# Patient Record
Sex: Female | Born: 1980 | Hispanic: Yes | Smoking: Never smoker
Health system: Southern US, Community
[De-identification: ages and names within clinical notes are randomized; demographics above are authoritative.]

## PROBLEM LIST (undated history)

## (undated) DIAGNOSIS — Z5189 Encounter for other specified aftercare: Secondary | ICD-10-CM

## (undated) DIAGNOSIS — Z87442 Personal history of urinary calculi: Secondary | ICD-10-CM

## (undated) DIAGNOSIS — R112 Nausea with vomiting, unspecified: Secondary | ICD-10-CM

## (undated) DIAGNOSIS — N879 Dysplasia of cervix uteri, unspecified: Secondary | ICD-10-CM

## (undated) DIAGNOSIS — D509 Iron deficiency anemia, unspecified: Secondary | ICD-10-CM

## (undated) DIAGNOSIS — Z9889 Other specified postprocedural states: Secondary | ICD-10-CM

## (undated) HISTORY — DX: Dysplasia of cervix uteri, unspecified: N87.9

---

## 2009-10-17 HISTORY — PX: LEEP: SHX91

## 2011-05-24 ENCOUNTER — Other Ambulatory Visit: Payer: Self-pay | Admitting: Nurse Practitioner

## 2011-05-24 ENCOUNTER — Other Ambulatory Visit (HOSPITAL_COMMUNITY)
Admission: RE | Admit: 2011-05-24 | Discharge: 2011-05-24 | Disposition: A | Discharge status: Home / Self Care | Payer: Self-pay | Source: Ambulatory Visit | Attending: Family Medicine | Admitting: Family Medicine

## 2011-05-24 ENCOUNTER — Other Ambulatory Visit (HOSPITAL_COMMUNITY)
Admission: RE | Admit: 2011-05-24 | Discharge: 2011-05-24 | Disposition: A | Discharge status: Home / Self Care | Payer: Self-pay | Source: Ambulatory Visit | Attending: Unknown Physician Specialty | Admitting: Unknown Physician Specialty

## 2011-05-24 DIAGNOSIS — R87612 Low grade squamous intraepithelial lesion on cytologic smear of cervix (LGSIL): Secondary | ICD-10-CM | POA: Insufficient documentation

## 2011-05-24 DIAGNOSIS — N871 Moderate cervical dysplasia: Secondary | ICD-10-CM | POA: Insufficient documentation

## 2011-07-12 ENCOUNTER — Other Ambulatory Visit: Payer: Self-pay | Admitting: Obstetrics and Gynecology

## 2011-10-18 NOTE — L&D Delivery Note (Signed)
Delivery Note At 10:55 PM a viable female was delivered via Vaginal, Spontaneous Delivery (Presentation: Left Occiput Anterior).  APGAR: 9, 9; weight: pending.   Placenta status: Intact, Spontaneous, via Tomasa Blase.  Cord: 3 vessels with the following complications: None.    Anesthesia: None  Episiotomy: None Lacerations: None Suture Repair: none Est. Blood Loss (mL): 65  Mom to postpartum.  Baby skin-to-skin with mom.  [name], SNM 09/27/2012, 11:11 PM Supervised by: Jacklyn Shell, CNM

## 2011-10-18 NOTE — L&D Delivery Note (Signed)
I was present for delivery and agree with above

## 2012-05-20 LAB — CYSTIC FIBROSIS DIAGNOSTIC STUDY: INTERPRETATION-CFDNA: NEGATIVE

## 2012-05-30 ENCOUNTER — Other Ambulatory Visit (HOSPITAL_COMMUNITY): Payer: Self-pay | Admitting: Family

## 2012-05-30 DIAGNOSIS — Z3689 Encounter for other specified antenatal screening: Secondary | ICD-10-CM

## 2012-05-30 LAB — CYSTIC FIBROSIS DIAGNOSTIC STUDY: Interpretation-CFDNA:: NEGATIVE

## 2012-05-30 LAB — OB RESULTS CONSOLE ABO/RH

## 2012-05-30 LAB — OB RESULTS CONSOLE ANTIBODY SCREEN: Antibody Screen: POSITIVE

## 2012-05-30 LAB — OB RESULTS CONSOLE RUBELLA ANTIBODY, IGM: Rubella: IMMUNE

## 2012-05-30 LAB — SICKLE CELL SCREEN: SICKLE CELL SCREEN: NEGATIVE

## 2012-06-04 ENCOUNTER — Ambulatory Visit (HOSPITAL_COMMUNITY)
Admission: RE | Admit: 2012-06-04 | Discharge: 2012-06-04 | Disposition: A | Discharge status: Home / Self Care | Payer: Self-pay | Source: Ambulatory Visit | Attending: Family | Admitting: Family

## 2012-06-04 DIAGNOSIS — O358XX0 Maternal care for other (suspected) fetal abnormality and damage, not applicable or unspecified: Secondary | ICD-10-CM | POA: Insufficient documentation

## 2012-06-04 DIAGNOSIS — Z3689 Encounter for other specified antenatal screening: Secondary | ICD-10-CM

## 2012-06-04 DIAGNOSIS — Z1389 Encounter for screening for other disorder: Secondary | ICD-10-CM | POA: Insufficient documentation

## 2012-06-04 DIAGNOSIS — Z363 Encounter for antenatal screening for malformations: Secondary | ICD-10-CM | POA: Insufficient documentation

## 2012-08-21 ENCOUNTER — Other Ambulatory Visit (HOSPITAL_COMMUNITY): Payer: Self-pay | Admitting: Family

## 2012-08-21 DIAGNOSIS — R319 Hematuria, unspecified: Secondary | ICD-10-CM

## 2012-08-24 ENCOUNTER — Encounter (HOSPITAL_COMMUNITY): Payer: Self-pay

## 2012-08-24 ENCOUNTER — Ambulatory Visit (HOSPITAL_COMMUNITY)
Admission: RE | Admit: 2012-08-24 | Discharge: 2012-08-24 | Disposition: A | Discharge status: Home / Self Care | Payer: PRIVATE HEALTH INSURANCE | Source: Ambulatory Visit | Attending: Family | Admitting: Family

## 2012-08-24 DIAGNOSIS — N200 Calculus of kidney: Secondary | ICD-10-CM | POA: Insufficient documentation

## 2012-08-24 DIAGNOSIS — R109 Unspecified abdominal pain: Secondary | ICD-10-CM | POA: Insufficient documentation

## 2012-08-24 DIAGNOSIS — R319 Hematuria, unspecified: Secondary | ICD-10-CM | POA: Insufficient documentation

## 2012-08-24 DIAGNOSIS — O99891 Other specified diseases and conditions complicating pregnancy: Secondary | ICD-10-CM | POA: Insufficient documentation

## 2012-09-05 LAB — OB RESULTS CONSOLE GC/CHLAMYDIA
Chlamydia: NEGATIVE
Gonorrhea: NEGATIVE

## 2012-09-06 LAB — OB RESULTS CONSOLE GBS: GBS: NEGATIVE

## 2012-09-26 ENCOUNTER — Other Ambulatory Visit (HOSPITAL_COMMUNITY): Payer: Self-pay | Admitting: Physician Assistant

## 2012-09-26 DIAGNOSIS — O480 Post-term pregnancy: Secondary | ICD-10-CM

## 2012-09-27 ENCOUNTER — Encounter (HOSPITAL_COMMUNITY): Payer: Self-pay | Admitting: *Deleted

## 2012-09-27 ENCOUNTER — Inpatient Hospital Stay (HOSPITAL_COMMUNITY)
Admission: AD | Admit: 2012-09-27 | Discharge: 2012-09-29 | DRG: 775 | Disposition: A | Discharge status: Home / Self Care | Payer: Medicaid Other | Source: Ambulatory Visit | Attending: Obstetrics & Gynecology | Admitting: Obstetrics & Gynecology

## 2012-09-27 DIAGNOSIS — IMO0001 Reserved for inherently not codable concepts without codable children: Secondary | ICD-10-CM

## 2012-09-27 DIAGNOSIS — O2200 Varicose veins of lower extremity in pregnancy, unspecified trimester: Secondary | ICD-10-CM

## 2012-09-27 LAB — CBC
Hemoglobin: 10.3 g/dL — ABNORMAL LOW (ref 12.0–15.0)
MCHC: 32 g/dL (ref 30.0–36.0)
Platelets: 203 10*3/uL (ref 150–400)
RDW: 16.1 % — ABNORMAL HIGH (ref 11.5–15.5)

## 2012-09-27 MED ORDER — IBUPROFEN 600 MG PO TABS
600.0000 mg | ORAL_TABLET | Freq: Four times a day (QID) | ORAL | Status: DC | PRN
  Administered 2012-09-27: 600 mg via ORAL
  Filled 2012-09-27: qty 1

## 2012-09-27 MED ORDER — LACTATED RINGERS IV SOLN: 500.0000 mL | INTRAVENOUS | Status: DC | PRN

## 2012-09-27 MED ORDER — ACETAMINOPHEN 325 MG PO TABS: 650.0000 mg | ORAL_TABLET | ORAL | Status: DC | PRN

## 2012-09-27 MED ORDER — ONDANSETRON HCL 4 MG/2ML IJ SOLN: 4.0000 mg | Freq: Four times a day (QID) | INTRAMUSCULAR | Status: DC | PRN

## 2012-09-27 MED ORDER — OXYTOCIN BOLUS FROM INFUSION: 500.0000 mL | INTRAVENOUS | Status: DC

## 2012-09-27 MED ORDER — LIDOCAINE HCL (PF) 1 % IJ SOLN: 30.0000 mL | INTRAMUSCULAR | Status: DC | PRN

## 2012-09-27 MED ORDER — FLEET ENEMA 7-19 GM/118ML RE ENEM: 1.0000 | ENEMA | RECTAL | Status: DC | PRN

## 2012-09-27 MED ORDER — OXYCODONE-ACETAMINOPHEN 5-325 MG PO TABS: 1.0000 | ORAL_TABLET | ORAL | Status: DC | PRN

## 2012-09-27 MED ORDER — CITRIC ACID-SODIUM CITRATE 334-500 MG/5ML PO SOLN: 30.0000 mL | ORAL | Status: DC | PRN

## 2012-09-27 MED ORDER — LACTATED RINGERS IV SOLN
INTRAVENOUS | Status: DC
  Administered 2012-09-27: 22:00:00 via INTRAVENOUS

## 2012-09-27 MED ORDER — OXYTOCIN 40 UNITS IN LACTATED RINGERS INFUSION - SIMPLE MED
62.5000 mL/h | INTRAVENOUS | Status: DC
  Administered 2012-09-27: 62.5 mL/h via INTRAVENOUS
  Filled 2012-09-27: qty 1000

## 2012-09-27 NOTE — MAU Note (Signed)
Contractions every 3-5 minutes for since yesterday. No vaginal bleeding or leaking of fluid

## 2012-09-27 NOTE — H&P (Signed)
Sharonlee Nine is a 31 y.o. female [id number] at [redacted] weeks gestation by second trimester ultrasound presenting for frequent contractions since yesterday at 8:00pm.   No vaginal bleeding or leaking of fluid.  Reports good fetal movement. Maternal Medical History:  Reason for admission: Reason for admission: contractions.  Contractions: Onset was yesterday.   Frequency: regular.   Duration is approximately 3 minutes.   Perceived severity is moderate.    Fetal activity: Perceived fetal activity is normal.   Last perceived fetal movement was within the past hour.      OB History    Grav Para Term Preterm Abortions TAB SAB Ect Mult Living   6 5 5       5      No past medical history on file. No past surgical history on file. Family History: family history is not on file. Social History:  does not have a smoking history on file. She does not have any smokeless tobacco history on file. Her alcohol and drug histories not on file.   Prenatal Transfer Tool  Maternal Diabetes: No Genetic Screening: Negative cystic fibrosis carrier screening, too late for other genetic screening Maternal Ultrasounds/Referrals: Normal Fetal Ultrasounds or other Referrals:  None Maternal Substance Abuse:  No Significant Maternal Medications:  None Significant Maternal Lab Results:  Lab values include: Other: Positive Anitbody Screen: Anti-E Other Comments:  None  Review of Systems  Constitutional: Negative.   HENT: Negative.   Eyes: Negative.   Respiratory: Negative.   Cardiovascular: Negative.   Gastrointestinal: Negative.   Genitourinary: Negative.   Musculoskeletal: Negative.   Skin: Negative.   Neurological: Negative.   Endo/Heme/Allergies: Negative.   Psychiatric/Behavioral: Negative.     Dilation: 6.5 Effacement (%): 90 Station: -2 Exam by:: L. Munford RN & [name], SNM Blood pressure 132/91, pulse 90, temperature 97.8 F (36.6 C), temperature source Oral, resp. rate 20. Maternal  Exam:  Uterine Assessment: Contraction strength is moderate.  Contraction duration is 3 minutes. Contraction frequency is regular.   Abdomen: Patient reports no abdominal tenderness. Fetal presentation: vertex  Introitus: Normal vulva. Pelvis: adequate for delivery.   Cervix: Cervix evaluated by digital exam.     Fetal Exam Fetal Monitor Review: Mode: ultrasound.   Baseline rate: 140.  Variability: moderate (6-25 bpm).   Pattern: accelerations present.    Fetal State Assessment: Category I - tracings are normal.     Physical Exam  Constitutional: She is oriented to person, place, and time. She appears well-developed and well-nourished.  HENT:  Head: Normocephalic and atraumatic.  Eyes: Conjunctivae normal are normal.  Neck: Normal range of motion. Neck supple.  Cardiovascular: Normal rate, regular rhythm, normal heart sounds and intact distal pulses.   Respiratory: Effort normal and breath sounds normal.  GI: Soft. Bowel sounds are normal.       Gravid  Genitourinary: Uterus normal.  Musculoskeletal: Normal range of motion.  Neurological: She is alert and oriented to person, place, and time. She has normal reflexes.  Skin: Skin is warm and dry.  Psychiatric: She has a normal mood and affect. Her behavior is normal. Judgment and thought content normal.    Prenatal labs: ABO, Rh:  A Positive Antibody:  Positive, Anti-E Rubella:  Immune  RPR:  Non-reactive  HBsAg:  Negative HIV:  Non-Reactive GBS:  Negative   Assessment/Plan: 31 year old [id number], IUP @ [redacted] wks gestation  Active Labor Advanced Dilation  Admit to L&D Routine L&D orders Patient may have epidural upon request  [name], SNM 09/27/2012, 10:19 PM Supervised by: Jacklyn Shell, CNM

## 2012-09-28 ENCOUNTER — Encounter (HOSPITAL_COMMUNITY): Payer: Self-pay | Admitting: *Deleted

## 2012-09-28 ENCOUNTER — Ambulatory Visit (HOSPITAL_COMMUNITY): Admission: RE | Admit: 2012-09-28 | Payer: Self-pay | Source: Ambulatory Visit

## 2012-09-28 LAB — RPR: RPR Ser Ql: NONREACTIVE

## 2012-09-28 MED ORDER — ZOLPIDEM TARTRATE 5 MG PO TABS: 5.0000 mg | ORAL_TABLET | Freq: Every evening | ORAL | Status: DC | PRN

## 2012-09-28 MED ORDER — DIPHENHYDRAMINE HCL 25 MG PO CAPS: 25.0000 mg | ORAL_CAPSULE | Freq: Four times a day (QID) | ORAL | Status: DC | PRN

## 2012-09-28 MED ORDER — PRENATAL MULTIVITAMIN CH
1.0000 | ORAL_TABLET | Freq: Every day | ORAL | Status: DC
  Administered 2012-09-28 – 2012-09-29 (×2): 1 via ORAL
  Filled 2012-09-28 (×2): qty 1

## 2012-09-28 MED ORDER — ONDANSETRON HCL 4 MG PO TABS: 4.0000 mg | ORAL_TABLET | ORAL | Status: DC | PRN

## 2012-09-28 MED ORDER — BENZOCAINE-MENTHOL 20-0.5 % EX AERO: 1.0000 "application " | INHALATION_SPRAY | CUTANEOUS | Status: DC | PRN

## 2012-09-28 MED ORDER — LANOLIN HYDROUS EX OINT: TOPICAL_OINTMENT | CUTANEOUS | Status: DC | PRN

## 2012-09-28 MED ORDER — ONDANSETRON HCL 4 MG/2ML IJ SOLN: 4.0000 mg | INTRAMUSCULAR | Status: DC | PRN

## 2012-09-28 MED ORDER — MEASLES, MUMPS & RUBELLA VAC ~~LOC~~ INJ
0.5000 mL | INJECTION | Freq: Once | SUBCUTANEOUS | Status: DC
  Filled 2012-09-28: qty 0.5

## 2012-09-28 MED ORDER — TETANUS-DIPHTH-ACELL PERTUSSIS 5-2.5-18.5 LF-MCG/0.5 IM SUSP: 0.5000 mL | Freq: Once | INTRAMUSCULAR | Status: DC

## 2012-09-28 MED ORDER — DIBUCAINE 1 % RE OINT: 1.0000 "application " | TOPICAL_OINTMENT | RECTAL | Status: DC | PRN

## 2012-09-28 MED ORDER — METHYLERGONOVINE MALEATE 0.2 MG PO TABS: 0.2000 mg | ORAL_TABLET | ORAL | Status: DC | PRN

## 2012-09-28 MED ORDER — CEPHALEXIN 500 MG PO CAPS
500.0000 mg | ORAL_CAPSULE | Freq: Two times a day (BID) | ORAL | Status: DC
  Administered 2012-09-28 – 2012-09-29 (×2): 500 mg via ORAL
  Filled 2012-09-28 (×4): qty 1

## 2012-09-28 MED ORDER — WITCH HAZEL-GLYCERIN EX PADS: 1.0000 "application " | MEDICATED_PAD | CUTANEOUS | Status: DC | PRN

## 2012-09-28 MED ORDER — METHYLERGONOVINE MALEATE 0.2 MG/ML IJ SOLN: 0.2000 mg | INTRAMUSCULAR | Status: DC | PRN

## 2012-09-28 MED ORDER — SENNOSIDES-DOCUSATE SODIUM 8.6-50 MG PO TABS
2.0000 | ORAL_TABLET | Freq: Every day | ORAL | Status: DC
  Administered 2012-09-28: 2 via ORAL

## 2012-09-28 MED ORDER — SIMETHICONE 80 MG PO CHEW: 80.0000 mg | CHEWABLE_TABLET | ORAL | Status: DC | PRN

## 2012-09-28 MED ORDER — IBUPROFEN 600 MG PO TABS
600.0000 mg | ORAL_TABLET | Freq: Four times a day (QID) | ORAL | Status: DC
  Administered 2012-09-28 – 2012-09-29 (×5): 600 mg via ORAL
  Filled 2012-09-28 (×5): qty 1

## 2012-09-28 MED ORDER — OXYCODONE-ACETAMINOPHEN 5-325 MG PO TABS: 1.0000 | ORAL_TABLET | ORAL | Status: DC | PRN

## 2012-09-28 NOTE — Progress Notes (Signed)
Post Partum Day 1  Subjective: no complaints, up ad lib, voiding, tolerating PO and + flatus Patient reports her pain level is well controlled.  Objective: Blood pressure 103/61, pulse 69, temperature 98.4 F (36.9 C), temperature source Oral, resp. rate 18, height 5\' 2"  (1.575 m), weight 85.73 kg (189 lb), unknown if currently breastfeeding.  Physical Exam:  General: alert, cooperative and no distress Lochia: appropriate Uterine Fundus: firm DVT Evaluation: No evidence of DVT seen on physical exam. No cords or calf tenderness.   Basename 09/27/12 2225  HGB 10.3*  HCT 32.2*    Assessment/Plan: Plan for discharge tomorrow, Breastfeeding and Social Work consult Patient is trying to breastfeed, wants OCP for birth control, no circumcision. 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   LOS: 1 day   Ruth Stewart 09/28/2012, 4:09 PM

## 2012-09-28 NOTE — Progress Notes (Signed)
UR chart review completed.  

## 2012-09-29 MED ORDER — IBUPROFEN 600 MG PO TABS: 600.0000 mg | ORAL_TABLET | Freq: Four times a day (QID) | ORAL | Status: DC | PRN
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
## 2012-09-29 NOTE — Clinical Social Work Note (Signed)
CSW spoke with MOB and interpreter in room.  No concerns for discharge at this time.  CSW informed RN of no concerns for discharge.  CSW will complete full consult report for Epic later today.  [phone] 

## 2012-09-30 NOTE — Progress Notes (Signed)
I saw and examined patient and agree with above. Napoleon Form, MD

## 2012-10-01 NOTE — Discharge Summary (Signed)
Attestation of Attending Supervision of Advanced Practitioner (CNM/NP): Evaluation and management procedures were performed by the Advanced Practitioner under my supervision and collaboration.  I have reviewed the Advanced Practitioner's note and chart, and I agree with the management and plan.  Samaira Holzworth 10/01/2012 3:42 PM

## 2012-10-05 ENCOUNTER — Other Ambulatory Visit: Payer: Self-pay

## 2012-10-06 ENCOUNTER — Inpatient Hospital Stay (HOSPITAL_COMMUNITY): Admission: RE | Admit: 2012-10-06 | Payer: Self-pay | Source: Ambulatory Visit

## 2014-08-18 ENCOUNTER — Encounter (HOSPITAL_COMMUNITY): Payer: Self-pay | Admitting: *Deleted

## 2017-10-17 NOTE — L&D Delivery Note (Addendum)
Delivery Note At 5:31 PM a viable female was delivered via Vaginal, Spontaneous (Presentation: ;  ).  APGAR: 9, 10; weight Pending .   Placenta status:Complete , .  Cord:3 vessel  with the following complications: none .  Cord pH: NA  Anesthesia: None Episiotomy: None Lacerations:None   Suture Repair: none Est. Blood Loss (mL): 400  Mom to postpartum.  Baby to Couplet care / Skin to Skin.  Sandi Ravelingnson B Alvis 05/10/2018, 5:56 PM  Please schedule this patient for Postpartum visit in: 4 weeks with the following provider: Any provider For C/S patients schedule nurse incision check in weeks 2 weeks: NA High risk pregnancy complicated by: Multiple antibodies, elevated AFP Delivery mode:  SVD Anticipated Birth Control:  other/unsure PP Procedures needed: None  Schedule Integrated BH visit: no  I was present for the entire delivery of baby and placenta and inspection of the perineum and agree with above.  Katrinka BlazingSmith, [place], [place] 05/10/2018 8:32 PM

## 2017-11-23 LAB — CYTOLOGY - PAP: Pap: NEGATIVE

## 2017-11-23 LAB — OB RESULTS CONSOLE GC/CHLAMYDIA
CHLAMYDIA, DNA PROBE: NEGATIVE
GC PROBE AMP, GENITAL: NEGATIVE

## 2017-11-23 LAB — OB RESULTS CONSOLE VARICELLA ZOSTER ANTIBODY, IGG: Varicella: NON-IMMUNE/NOT IMMUNE

## 2017-11-23 LAB — OB RESULTS CONSOLE HEPATITIS B SURFACE ANTIGEN: Hepatitis B Surface Ag: NEGATIVE

## 2017-11-23 LAB — OB RESULTS CONSOLE HIV ANTIBODY (ROUTINE TESTING): HIV: NONREACTIVE

## 2017-11-23 LAB — OB RESULTS CONSOLE PLATELET COUNT: Platelets: 271

## 2017-11-23 LAB — GLUCOSE, 1 HOUR

## 2017-11-23 LAB — OB RESULTS CONSOLE ABO/RH: RH Type: POSITIVE

## 2017-11-23 LAB — OB RESULTS CONSOLE HGB/HCT, BLOOD
HEMATOCRIT: 31
HEMOGLOBIN: 10.2

## 2017-11-23 LAB — OB RESULTS CONSOLE ANTIBODY SCREEN: ANTIBODY SCREEN: POSITIVE

## 2017-11-23 LAB — OB RESULTS CONSOLE RPR: RPR: NONREACTIVE

## 2017-11-23 LAB — OB RESULTS CONSOLE RUBELLA ANTIBODY, IGM: Rubella: IMMUNE

## 2017-11-23 LAB — CULTURE, OB URINE: Urine Culture, OB: POSITIVE

## 2017-11-27 ENCOUNTER — Encounter (HOSPITAL_COMMUNITY): Payer: Self-pay | Admitting: *Deleted

## 2017-11-28 ENCOUNTER — Other Ambulatory Visit: Payer: Self-pay

## 2017-11-28 ENCOUNTER — Ambulatory Visit (HOSPITAL_COMMUNITY)
Admission: RE | Admit: 2017-11-28 | Discharge: 2017-11-28 | Disposition: A | Discharge status: Home / Self Care | Payer: PRIVATE HEALTH INSURANCE | Source: Ambulatory Visit | Attending: Nurse Practitioner | Admitting: Nurse Practitioner

## 2017-11-28 DIAGNOSIS — O09529 Supervision of elderly multigravida, unspecified trimester: Secondary | ICD-10-CM

## 2017-11-28 DIAGNOSIS — Z3A16 16 weeks gestation of pregnancy: Secondary | ICD-10-CM | POA: Insufficient documentation
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
## 2018-01-08 ENCOUNTER — Encounter (HOSPITAL_COMMUNITY): Payer: Self-pay

## 2018-01-09 ENCOUNTER — Encounter (HOSPITAL_COMMUNITY): Payer: Self-pay

## 2018-01-09 ENCOUNTER — Ambulatory Visit (HOSPITAL_COMMUNITY)
Admission: RE | Admit: 2018-01-09 | Discharge: 2018-01-09 | Disposition: A | Discharge status: Home / Self Care | Payer: PRIVATE HEALTH INSURANCE | Source: Ambulatory Visit | Attending: Nurse Practitioner | Admitting: Nurse Practitioner

## 2018-01-09 DIAGNOSIS — Z3A22 22 weeks gestation of pregnancy: Secondary | ICD-10-CM | POA: Insufficient documentation

## 2018-01-09 DIAGNOSIS — O360920 Maternal care for other rhesus isoimmunization, second trimester, not applicable or unspecified: Secondary | ICD-10-CM | POA: Insufficient documentation

## 2018-01-09 HISTORY — DX: Encounter for other specified aftercare: Z51.89

## 2018-01-09 NOTE — Consult Note (Signed)
MFM Staff Note, Consultation ? Impressions: 1. 37yo [id number] with SIUP at 6070w1d 2. 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 AMA, declined genetic counseling/NIPS/amniocentesis ? Discussion:  By way of consultation with on site Spanish translator, I spoke to Ruth Stewart  about her red blood cell sensitization. In simple terms, I explained the concepts of red cell antigens, antibody response, and alloimmunization. 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 Although she herself will have no health consequences from the sensitization, each time she carries an incompatible fetus, it will be at risk.   I explained the inheritance pattern of red cell antigens.  The father of the baby is not here today for testing.  I told her that my assumption is that her partner/husband is antigen-positive, but it is unknown whether he is a homozygote or heterozygote. As you know, the risk of the fetus being incompatible and at risk for hemolysis and anemia is 50% if he is heterozygous, and 100% if he is homozygous for the antigen. I told her that if RBC genotyping is desired, she can have him present to your office.    I went over the mechanism of fetal anemia due to transplacental antibody passage. I detailed the usual management of women with alloimmunization and an incompatible fetus, including the use of serial antibody titers, the concept of the "critical titer", and the need for middle cerebral artery blood flow measurement to estimate the degree of anemia.  Ruth Stewart  does not have a previous affected pregnancy, and her anti-E titer 1:1 is exceedingly low. Based solely on this, I told her that it is very unlikely that her fetus will be severely anemic, either before or after birth. I recommended that she have antibody titers drawn monthly. So long as her titer  is 1:8 or less (<1:16), no MCA studies will be recommended, and she can be allowed to carry to term. On the other hand, if the titers rise to 1:16 or greater, re-evaluation will be indicated, with MCA Dopplers weekly.    RBC genotyping is important so if desired this may be done in your office/lab provided the FOB will present to be testing. RBC genotyping of dad is important for prognosis of future pregnancies (zygosity will dictate recurrence risk: 100% if he is homozygous for E-antigen, 50% if he is heterozygous). ? After birth, the pediatricians should be notified of her antibody status, so that the newborn can be followed closely for signs of hyperbilirubinemia.  Recommendations:  1. monthly antibody titers in your office 2. Referral to MFM for MCA Dopplers if a critical titer is seen.  MCA Dopplers and interval growth will be needed only if titer is 1:16 or greater (note: recommend co-management or transfer of care to MFM in setting of critical titer) 3. paternal RBC genotyping for Rh zygosity is important and I recommend it be [facility] (future pregnancy counseling) 4. Given maternal age of 37, I recommend interval growth evaluation every 6 weeks. 5. While at this point, she can deliver at 39-41 weeks with no critical titer.  Critical titer would lead to recommendation for delivery at 38 weeks provided MCA Dopplers and antenatal testing are reassuring (only needed in setting of at-risk/affected fetus and again should be under MFM surveillance as further recommendations may apply in such context).  I spent in excess  of 60 minutes in evaluation, review, and counseling of your patient with >50% of this time being spent on direct face-to-face counseling.  Ruth Nora, MD, MS, FACOG Assistant Professor Section of Maternal-Fetal Medicine [facility]

## 2018-01-09 NOTE — Progress Notes (Signed)
MFM Staff Note, Consultation ? Impressions: 1. 37yo [id number] with SIUP at 6070w1d 2. 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 AMA, declined genetic counseling/NIPS/amniocentesis ? Discussion:  By way of consultation with on site Spanish translator, I spoke to Ruth Stewart  about her red blood cell sensitization. In simple terms, I explained the concepts of red cell antigens, antibody response, and alloimmunization. 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 Although she herself will have no health consequences from the sensitization, each time she carries an incompatible fetus, it will be at risk.   I explained the inheritance pattern of red cell antigens.  The father of the baby is not here today for testing.  I told her that my assumption is that her partner/husband is antigen-positive, but it is unknown whether he is a homozygote or heterozygote. As you know, the risk of the fetus being incompatible and at risk for hemolysis and anemia is 50% if he is heterozygous, and 100% if he is homozygous for the antigen. I told her that if RBC genotyping is desired, she can have him present to your office.    I went over the mechanism of fetal anemia due to transplacental antibody passage. I detailed the usual management of women with alloimmunization and an incompatible fetus, including the use of serial antibody titers, the concept of the "critical titer", and the need for middle cerebral artery blood flow measurement to estimate the degree of anemia.  Ruth Stewart  does not have a previous affected pregnancy, and her anti-E titer 1:1 is exceedingly low. Based solely on this, I told her that it is very unlikely that her fetus will be severely anemic, either before or after birth. I recommended that she have antibody titers drawn monthly. So long as her titer  is 1:8 or less (<1:16), no MCA studies will be recommended, and she can be allowed to carry to term. On the other hand, if the titers rise to 1:16 or greater, re-evaluation will be indicated, with MCA Dopplers weekly.    RBC genotyping is important so if desired this may be done in your office/lab provided the FOB will present to be testing. RBC genotyping of dad is important for prognosis of future pregnancies (zygosity will dictate recurrence risk: 100% if he is homozygous for E-antigen, 50% if he is heterozygous). ? After birth, the pediatricians should be notified of her antibody status, so that the newborn can be followed closely for signs of hyperbilirubinemia.  Recommendations:  1. monthly antibody titers in your office 2. Referral to MFM for MCA Dopplers if a critical titer is seen.  MCA Dopplers and interval growth will be needed only if titer is 1:16 or greater (note: recommend co-management or transfer of care to MFM in setting of critical titer) 3. paternal RBC genotyping for Rh zygosity is important and I recommend it be [facility] (future pregnancy counseling) 4. Given maternal age of 37, I recommend interval growth evaluation every 6 weeks. 5. While at this point, she can deliver at 39-41 weeks with no critical titer.  Critical titer would lead to recommendation for delivery at 38 weeks provided MCA Dopplers and antenatal testing are reassuring (only needed in setting of at-risk/affected fetus and again should be under MFM surveillance as further recommendations may apply in such context).  I spent in excess  of 60 minutes in evaluation, review, and counseling of your patient with >50% of this time being spent on direct face-to-face counseling.  Durwin Nora, MD, MS, FACOG Assistant Professor Section of Maternal-Fetal Medicine [facility]

## 2018-01-11 ENCOUNTER — Other Ambulatory Visit: Payer: Self-pay

## 2018-02-05 ENCOUNTER — Encounter: Payer: Self-pay | Admitting: *Deleted

## 2018-02-07 ENCOUNTER — Encounter: Payer: Self-pay | Admitting: Obstetrics and Gynecology

## 2018-02-07 ENCOUNTER — Ambulatory Visit (INDEPENDENT_AMBULATORY_CARE_PROVIDER_SITE_OTHER): Payer: Self-pay | Admitting: Obstetrics and Gynecology

## 2018-02-07 VITALS — BP 91/62 | HR 106 | Wt 202.0 lb

## 2018-02-07 DIAGNOSIS — O09219 Supervision of pregnancy with history of pre-term labor, unspecified trimester: Secondary | ICD-10-CM

## 2018-02-07 DIAGNOSIS — Z789 Other specified health status: Secondary | ICD-10-CM

## 2018-02-07 DIAGNOSIS — R772 Abnormality of alphafetoprotein: Secondary | ICD-10-CM | POA: Insufficient documentation

## 2018-02-07 DIAGNOSIS — O0990 Supervision of high risk pregnancy, unspecified, unspecified trimester: Secondary | ICD-10-CM

## 2018-02-07 DIAGNOSIS — O360930 Maternal care for other rhesus isoimmunization, third trimester, not applicable or unspecified: Secondary | ICD-10-CM | POA: Insufficient documentation

## 2018-02-07 DIAGNOSIS — O2612 Low weight gain in pregnancy, second trimester: Secondary | ICD-10-CM

## 2018-02-07 DIAGNOSIS — Z758 Other problems related to medical facilities and other health care: Secondary | ICD-10-CM | POA: Insufficient documentation

## 2018-02-07 DIAGNOSIS — O2610 Low weight gain in pregnancy, unspecified trimester: Secondary | ICD-10-CM | POA: Insufficient documentation

## 2018-02-07 DIAGNOSIS — O09899 Supervision of other high risk pregnancies, unspecified trimester: Secondary | ICD-10-CM | POA: Insufficient documentation

## 2018-02-07 DIAGNOSIS — O09212 Supervision of pregnancy with history of pre-term labor, second trimester: Secondary | ICD-10-CM

## 2018-02-07 DIAGNOSIS — O360920 Maternal care for other rhesus isoimmunization, second trimester, not applicable or unspecified: Secondary | ICD-10-CM

## 2018-02-07 DIAGNOSIS — Z87442 Personal history of urinary calculi: Secondary | ICD-10-CM

## 2018-02-07 DIAGNOSIS — O0992 Supervision of high risk pregnancy, unspecified, second trimester: Secondary | ICD-10-CM

## 2018-02-07 LAB — POCT URINALYSIS DIP (DEVICE)
Glucose, UA: NEGATIVE mg/dL
Nitrite: POSITIVE — AB
Protein, ur: 100 mg/dL — AB
UROBILINOGEN UA: 0.2 mg/dL (ref 0.0–1.0)
pH: 6.5 (ref 5.0–8.0)

## 2018-02-07 MED ORDER — PRENATAL VITAMINS 0.8 MG PO TABS: 1.0000 | ORAL_TABLET | Freq: Every day | ORAL | 12 refills | Status: DC

## 2018-02-07 MED ORDER — CEPHALEXIN 500 MG PO CAPS: 500.0000 mg | ORAL_CAPSULE | Freq: Three times a day (TID) | ORAL | 0 refills | Status: DC

## 2018-02-07 NOTE — Progress Notes (Signed)
Here for new ob appt. Transferred from health department. C/o pain on 02/03/18 around her umbilicus and all the down her abdomen. Given new patient booklets. Sent MyChart text to sign up. Signed up for [facility] app only.

## 2018-02-07 NOTE — Progress Notes (Signed)
   PRENATAL VISIT NOTE - transfer from [facility]  Subjective:  Ruth Stewart is a 37 y.o. [id number] at 4023w2d being seen today for ongoing prenatal care.  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  Patient reports no complaints.  Contractions: Not present. Vag. Bleeding: None.  Movement: Present. Denies leaking of fluid.   Patient has h/o 5 x SVD. One pre-term delivery at 36 weeks, patient reports she was induced but not sure why. Had declined 17P in prior pregnancy and this one.  The following portions of the patient's history were reviewed and updated as appropriate: allergies, current medications, past family history, past medical history, past social history, past surgical history and problem list. Problem list updated.  Objective:   Vitals:   02/07/18 1525  BP: 91/62  Pulse: (!) 106  Weight: 202 lb (91.6 kg)    Fetal Status: Fetal Heart Rate (bpm): 149   Movement: Present     General:  Alert, oriented and cooperative. Patient is in no acute distress.  Skin: Skin is warm and dry. No rash noted.   Cardiovascular: Normal heart rate noted  Respiratory: Normal respiratory effort, no problems with respiration noted  Abdomen: Soft, gravid, appropriate for gestational age.  Pain/Pressure: Present     Pelvic: Cervical exam deferred        Extremities: Normal range of motion.  Edema: Trace  Mental Status: Normal mood and affect. Normal behavior. Normal judgment and thought content.   Assessment and Plan:  Pregnancy: [id number] at 4423w2d  1. 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 Supervision of high risk pregnancy, antepartum - US MFM OB DETAIL +14 WK; Future  3. Low weight gain during pregnancy, antepartum Has gained 2 pounds since beginning of pregnancy Reports she gets extremely tired when she eats so she hasn't been eating a lot Had significant nausea at beginning of pregnancy but is improved now  4. Language barrier Spanish speaking  5. UTI Keflex sent to pharmacy  6. Elevated AFP 1:259, declined further testing  7. H/O preterm delivery, currently pregnant Induction at 36 weeks, patient not sure why, previously declined 17P   Preterm labor symptoms and general obstetric precautions including but not limited to vaginal bleeding, contractions, leaking of fluid and fetal movement were reviewed in detail with the patient. Please refer to After Visit Summary for other counseling recommendations.  Return in about 2 weeks (around 02/21/2018) for fasting 2hr gtt/ 28 wk labs/ ob fu., OB visit (MD), 2 hr GTT, 3rd trim labs.  No future appointments.  Conan BowensKelly M Franci Oshana, MD

## 2018-02-09 LAB — ANTIBODY SCREEN

## 2018-02-09 LAB — AB SCR+ANTIBODY ID: Antibody Screen: POSITIVE — AB

## 2018-02-23 ENCOUNTER — Other Ambulatory Visit: Payer: Self-pay | Admitting: *Deleted

## 2018-02-23 DIAGNOSIS — O0990 Supervision of high risk pregnancy, unspecified, unspecified trimester: Secondary | ICD-10-CM

## 2018-02-26 ENCOUNTER — Ambulatory Visit (INDEPENDENT_AMBULATORY_CARE_PROVIDER_SITE_OTHER): Payer: Self-pay | Admitting: Obstetrics & Gynecology

## 2018-02-26 ENCOUNTER — Other Ambulatory Visit: Payer: Self-pay

## 2018-02-26 VITALS — BP 95/58 | HR 91 | Wt 202.0 lb

## 2018-02-26 DIAGNOSIS — O0990 Supervision of high risk pregnancy, unspecified, unspecified trimester: Secondary | ICD-10-CM

## 2018-02-26 DIAGNOSIS — Z789 Other specified health status: Secondary | ICD-10-CM

## 2018-02-26 DIAGNOSIS — O360920 Maternal care for other rhesus isoimmunization, second trimester, not applicable or unspecified: Secondary | ICD-10-CM

## 2018-02-26 DIAGNOSIS — O99210 Obesity complicating pregnancy, unspecified trimester: Secondary | ICD-10-CM | POA: Insufficient documentation

## 2018-02-26 NOTE — Progress Notes (Signed)
   PRENATAL VISIT NOTE  Subjective:  Ruth Stewart is a 37 y.o. [id number] at [redacted]w[redacted]d being seen today for ongoing prenatal care.  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  Patient reports no complaints.  Contractions: Not present. Vag. Bleeding: None.  Movement: Present. Denies leaking of fluid.   The following portions of the patient's history were reviewed and updated as appropriate: allergies, current medications, past family history, past medical history, past social history, past surgical history and problem list. Problem list updated.  Objective:   Vitals:   02/26/18 1007  BP: (!) 95/58  Pulse: 91  Weight: 202 lb (91.6 kg)    Fetal Status: Fetal Heart Rate (bpm): 146   Movement: Present     General:  Alert, oriented and cooperative. Patient is in no acute distress.  Skin: Skin is warm and dry. No rash noted.   Cardiovascular: Normal heart rate noted  Respiratory: Normal respiratory effort, no problems with respiration noted  Abdomen: Soft, gravid, appropriate for gestational age.  Pain/Pressure: Present     Pelvic: Cervical exam deferred        Extremities: Normal range of motion.     Mental Status: Normal mood and affect. Normal behavior. Normal judgment and thought content.   Assessment and Plan:  Pregnancy: [id number] at [redacted]w[redacted]d  1. Obesity affecting pregnancy, antepartum   2. Supervision of high risk pregnancy, antepartum  - CBC - HIV antibody - RPR - TSH - Glucose Tolerance, 2 Hours w/1 Hour - TDAP  3. Antibody E  isoimmunization affecting pregnancy in second trimester, single or unspecified fetus - anti E titre today - [place] MFM OB FOLLOW UP; Future  4. Language barrier - Live interpretor present for exam  Preterm labor symptoms and general obstetric precautions including but not limited to vaginal bleeding, contractions, leaking of fluid and fetal movement were reviewed in detail with the patient. Please refer to After Visit Summary for other counseling recommendations.  No follow-ups on file.  Future Appointments  Date Time Provider Department Center  02/26/2018 10:35 AM Allie Bossier, MD [facility]    Allie Bossier, MD

## 2018-02-26 NOTE — Addendum Note (Signed)
Addended by: Faythe Casa on: 02/26/2018 01:31 PM   Modules accepted: Orders

## 2018-02-27 ENCOUNTER — Encounter: Payer: Self-pay | Admitting: Obstetrics & Gynecology

## 2018-02-27 DIAGNOSIS — D509 Iron deficiency anemia, unspecified: Secondary | ICD-10-CM | POA: Insufficient documentation

## 2018-02-27 DIAGNOSIS — O99013 Anemia complicating pregnancy, third trimester: Secondary | ICD-10-CM

## 2018-02-27 LAB — CBC
HEMATOCRIT: 26.1 % — AB (ref 34.0–46.6)
HEMOGLOBIN: 7.9 g/dL — AB (ref 11.1–15.9)
MCH: 24.1 pg — ABNORMAL LOW (ref 26.6–33.0)
MCHC: 30.3 g/dL — ABNORMAL LOW (ref 31.5–35.7)
MCV: 80 fL (ref 79–97)
Platelets: 337 10*3/uL (ref 150–379)
RBC: 3.28 x10E6/uL — ABNORMAL LOW (ref 3.77–5.28)
RDW: 15.6 % — ABNORMAL HIGH (ref 12.3–15.4)
WBC: 7.8 10*3/uL (ref 3.4–10.8)

## 2018-02-27 LAB — GLUCOSE TOLERANCE, 2 HOURS W/ 1HR
GLUCOSE, 2 HOUR: 115 mg/dL (ref 65–152)
Glucose, 1 hour: 150 mg/dL (ref 65–179)
Glucose, Fasting: 77 mg/dL (ref 65–91)

## 2018-02-27 LAB — RPR: RPR: NONREACTIVE

## 2018-02-27 LAB — HIV ANTIBODY (ROUTINE TESTING W REFLEX): HIV SCREEN 4TH GENERATION: NONREACTIVE

## 2018-03-08 ENCOUNTER — Other Ambulatory Visit: Payer: Self-pay | Admitting: Obstetrics and Gynecology

## 2018-03-08 ENCOUNTER — Ambulatory Visit (HOSPITAL_COMMUNITY)
Admission: RE | Admit: 2018-03-08 | Discharge: 2018-03-08 | Disposition: A | Discharge status: Home / Self Care | Payer: Self-pay | Source: Ambulatory Visit | Attending: Obstetrics & Gynecology | Admitting: Obstetrics & Gynecology

## 2018-03-08 ENCOUNTER — Encounter (HOSPITAL_COMMUNITY): Payer: Self-pay

## 2018-03-08 ENCOUNTER — Other Ambulatory Visit: Payer: Self-pay | Admitting: Obstetrics & Gynecology

## 2018-03-08 DIAGNOSIS — Z3A30 30 weeks gestation of pregnancy: Secondary | ICD-10-CM

## 2018-03-08 DIAGNOSIS — O0990 Supervision of high risk pregnancy, unspecified, unspecified trimester: Secondary | ICD-10-CM

## 2018-03-08 DIAGNOSIS — Z363 Encounter for antenatal screening for malformations: Secondary | ICD-10-CM

## 2018-03-08 DIAGNOSIS — O09899 Supervision of other high risk pregnancies, unspecified trimester: Secondary | ICD-10-CM

## 2018-03-08 DIAGNOSIS — O99013 Anemia complicating pregnancy, third trimester: Secondary | ICD-10-CM

## 2018-03-08 DIAGNOSIS — O280 Abnormal hematological finding on antenatal screening of mother: Secondary | ICD-10-CM

## 2018-03-08 DIAGNOSIS — R772 Abnormality of alphafetoprotein: Secondary | ICD-10-CM

## 2018-03-08 DIAGNOSIS — Z789 Other specified health status: Secondary | ICD-10-CM

## 2018-03-08 DIAGNOSIS — O09219 Supervision of pregnancy with history of pre-term labor, unspecified trimester: Secondary | ICD-10-CM

## 2018-03-08 DIAGNOSIS — O09523 Supervision of elderly multigravida, third trimester: Secondary | ICD-10-CM

## 2018-03-08 DIAGNOSIS — O289 Unspecified abnormal findings on antenatal screening of mother: Secondary | ICD-10-CM | POA: Insufficient documentation

## 2018-03-08 DIAGNOSIS — O99210 Obesity complicating pregnancy, unspecified trimester: Secondary | ICD-10-CM

## 2018-03-08 DIAGNOSIS — O09529 Supervision of elderly multigravida, unspecified trimester: Secondary | ICD-10-CM

## 2018-03-08 DIAGNOSIS — O2610 Low weight gain in pregnancy, unspecified trimester: Secondary | ICD-10-CM

## 2018-03-08 DIAGNOSIS — O360920 Maternal care for other rhesus isoimmunization, second trimester, not applicable or unspecified: Secondary | ICD-10-CM

## 2018-03-08 DIAGNOSIS — O360930 Maternal care for other rhesus isoimmunization, third trimester, not applicable or unspecified: Secondary | ICD-10-CM | POA: Insufficient documentation

## 2018-03-09 ENCOUNTER — Telehealth: Payer: Self-pay

## 2018-03-09 NOTE — Telephone Encounter (Signed)
Called [facility] and gave verbal to start fereheme weekly for 4 weeks due to her anemia.

## 2018-03-14 LAB — TSH: TSH: 1.88 u[IU]/mL (ref 0.450–4.500)

## 2018-03-14 LAB — SPECIMEN STATUS REPORT

## 2018-03-19 ENCOUNTER — Ambulatory Visit (INDEPENDENT_AMBULATORY_CARE_PROVIDER_SITE_OTHER): Payer: Self-pay | Admitting: Obstetrics and Gynecology

## 2018-03-19 ENCOUNTER — Encounter: Payer: Self-pay | Admitting: Obstetrics and Gynecology

## 2018-03-19 VITALS — BP 94/76 | HR 94 | Wt 204.5 lb

## 2018-03-19 DIAGNOSIS — O0990 Supervision of high risk pregnancy, unspecified, unspecified trimester: Secondary | ICD-10-CM

## 2018-03-19 DIAGNOSIS — O360930 Maternal care for other rhesus isoimmunization, third trimester, not applicable or unspecified: Secondary | ICD-10-CM

## 2018-03-19 DIAGNOSIS — O09213 Supervision of pregnancy with history of pre-term labor, third trimester: Secondary | ICD-10-CM

## 2018-03-19 DIAGNOSIS — R772 Abnormality of alphafetoprotein: Secondary | ICD-10-CM

## 2018-03-19 DIAGNOSIS — Z23 Encounter for immunization: Secondary | ICD-10-CM

## 2018-03-19 DIAGNOSIS — O09899 Supervision of other high risk pregnancies, unspecified trimester: Secondary | ICD-10-CM

## 2018-03-19 DIAGNOSIS — Z789 Other specified health status: Secondary | ICD-10-CM

## 2018-03-19 DIAGNOSIS — O360920 Maternal care for other rhesus isoimmunization, second trimester, not applicable or unspecified: Secondary | ICD-10-CM

## 2018-03-19 DIAGNOSIS — O2610 Low weight gain in pregnancy, unspecified trimester: Secondary | ICD-10-CM

## 2018-03-19 DIAGNOSIS — O2613 Low weight gain in pregnancy, third trimester: Secondary | ICD-10-CM

## 2018-03-19 DIAGNOSIS — O0993 Supervision of high risk pregnancy, unspecified, third trimester: Secondary | ICD-10-CM

## 2018-03-19 DIAGNOSIS — O09219 Supervision of pregnancy with history of pre-term labor, unspecified trimester: Secondary | ICD-10-CM

## 2018-03-19 MED ORDER — FERROUS SULFATE 325 (65 FE) MG PO TABS: 325.0000 mg | ORAL_TABLET | Freq: Two times a day (BID) | ORAL | 1 refills | Status: DC

## 2018-03-19 NOTE — Progress Notes (Signed)
   PRENATAL VISIT NOTE  Subjective:  Ruth Stewart is a 37 y.o. [id number] at 3145w0d being seen today for ongoing prenatal care.  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  Patient reports occasional contractions.  Contractions: Irritability. Vag. Bleeding: None.  Movement: Present. Denies leaking of fluid.   The following portions of the patient's history were reviewed and updated as appropriate: allergies, current medications, past family history, past medical history, past social history, past surgical history and problem list. Problem list updated.  Objective:   Vitals:   03/19/18 1317  BP: 94/76  Pulse: 94  Weight: 204 lb 8 oz (92.8 kg)   Fetal Status: Fetal Heart Rate (bpm): 143   Movement: Present     General:  Alert, oriented and cooperative. Patient is in no acute distress.  Skin: Skin is warm and dry. No rash noted.   Cardiovascular: Normal heart rate noted  Respiratory: Normal respiratory effort, no problems with respiration noted  Abdomen: Soft, gravid, appropriate for gestational age.  Pain/Pressure: Present     Pelvic: Cervical exam deferred        Extremities: Normal range of motion.  Edema: Trace  Mental Status: Normal mood and affect. Normal behavior. Normal judgment and thought content.   Assessment and Plan:  Pregnancy: [id number] at 4845w0d  1. Elevated AFP 1:259, declined further testing  2. H/O preterm delivery, currently pregnant Declined 17 P  3. Antibody E isoimmunization affecting  pregnancy in second trimester, single or unspecified fetus Monthly titers per MFM If > 1:64, need to start MCA dopplers Antibody titers to be drawn today  4. Supervision of high risk pregnancy, antepartum Tdap today  5. Low weight gain during pregnancy, antepartum  6. Language barrier Engineer, structuralpanish translator used  7. Contraception  Interested in BTL, no insurance, will consider Nexplanon Gave info today  Preterm labor symptoms and general obstetric precautions including but not limited to vaginal bleeding, contractions, leaking of fluid and fetal movement were reviewed in detail with the patient. Please refer to After Visit Summary for other counseling recommendations.  Return in about 2 weeks (around 04/02/2018) for OB visit (MD).  Future Appointments  Date Time Provider Department Center  04/04/2018  1:35 PM Amo BingPickens, Charlie, MD [facility]    Conan BowensKelly M Paulene Tayag, MD

## 2018-03-19 NOTE — Patient Instructions (Signed)
Etonogestrel implant Qu es este medicamento? El ETONOGESTREL es un dispositivo anticonceptivo (control de la natalidad). Se usa para evitar los embarazos. Se puede usar hasta por 3 aos. Este medicamento puede ser utilizado para otros usos; si tiene alguna pregunta consulte con su proveedor de atencin mdica o con su farmacutico. MARCAS COMUNES: Implanon, Nexplanon Qu le debo informar a mi profesional de la salud antes de tomar este medicamento? Necesita saber si usted presenta alguno de los siguientes problemas o situaciones: sangrado vaginal anormal enfermedad vascular o cogulos sanguneos cncer de mama, cervical, heptico depresin diabetes enfermedad de la vescula biliar dolores de cabeza enfermedad cardiaca o ataque cardiaco reciente alta presin sangunea alto nivel de colesterol enfermedad renal enfermedad heptica convulsiones fuma tabaco una reaccin alrgica o inusual al etonogestrel, otras hormonas, anestsicos o antispticos, medicamentos, alimentos, colorantes o conservantes si est embarazada o buscando quedar embarazada si est amamantando a un beb Cmo debo utilizar este medicamento? Un profesional de la salud inserta este dispositivo justo debajo de la piel en la parte interior del brazo. Hable con su pediatra para informarse acerca del uso de este medicamento en nios. Puede requerir atencin especial. Sobredosis: Pngase en contacto inmediatamente con un centro toxicolgico o una sala de urgencia si usted cree que haya tomado demasiado medicamento. ATENCIN: Este medicamento es solo para usted. No comparta este medicamento con nadie. Qu sucede si me olvido de una dosis? No se aplica en este caso. Qu puede interactuar con este medicamento? No tome este medicamento con ninguno de los siguientes frmacos: amprenavir bosentano fosamprenavir Este medicamento tambin podra interactuar con los siguientes medicamentos: barbitricos para inducir el sueo o para el  tratamiento de convulsiones ciertos medicamentos para infecciones micticas, tales como itraconazol y ketoconazol jugo de toronja griseofulvina medicamentos para tratar convulsiones, tales como carbamazepina, felbamato, oxcarbazepina, fenitona, topiramato modafinilo fenilbutazona rifampicina rufinamida algunos medicamentos para tratar la infeccin por el VIH, tales como atazanavir, indinavir, lopinavir, nelfinavir, tipranavir, ritonavir hierba de [place] Puede ser que esta lista no menciona todas las posibles interacciones. Informe a su profesional de la salud de todos los productos a base de hierbas, medicamentos de venta libre o suplementos nutritivos que est tomando. Si usted fuma, consume bebidas alcohlicas o si utiliza drogas ilegales, indqueselo tambin a su profesional de la salud. Algunas sustancias pueden interactuar con su medicamento. A qu debo estar atento al usar este medicamento? Este producto no protege contra la infeccin por el VIH (SIDA) u otras enfermedades de transmisin sexual. Usted debe sentir el implante al presionar con las yemas de los dedos sobre la piel donde se insert. Contacte a su mdico si no se siente el implante y usa un mtodo anticonceptivo no hormonal (como el condn) hasta que el mdico confirma que el implante est en su lugar. Si siente que el implante puede haber roto o doblado en su brazo, pngase en contacto con su proveedor de atencin mdica. Qu efectos secundarios puedo tener al utilizar este medicamento? Efectos secundarios que debe informar a su mdico o a su profesional de la salud tan pronto como sea posible: reacciones alrgicas, como erupcin cutnea, picazn o urticarias, e hinchazn de la cara, los labios o la lengua bultos en las mamas cambios en las emociones o el estado de nimo estado de nimo deprimido sangrado menstrual intenso o prolongado dolor, irritacin, hinchazn o moretones en el lugar de la insercin cicatriz en el lugar de la  insercin signos de infeccin en el sitio de insercin tales como fiebre, y enrojecimiento de   la piel, dolor o secrecin signos de embarazo signos y sntomas de un cogulo sanguneo, tales como problemas respiratorios; cambios en la visin; dolor en el pecho; dolor de cabeza severo, repentino; dolor, hinchazn, calor en la pierna; dificultad para hablar; entumecimiento o debilidad repentina de la cara, el brazo o la pierna signos y sntomas de lesin al hgado, como orina amarilla oscura o marrn; sensacin general de estar enfermo o sntomas gripales; heces claras; prdida de apetito; nuseas; dolor en la regin abdominal superior derecha; cansancio o debilidad inusual; color amarillento de los ojos o la piel sangrado vaginal inusual, secrecin signos y sntomas de un derrame cerebral, tales como cambios en la visin; confusin; dificultad para hablar o entender; dolores de cabeza severos; entumecimiento o debilidad repentina de la cara, el brazo o la pierna; problemas al caminar; mareo; prdida del equilibrio o la coordinacin Efectos secundarios que generalmente no requieren atencin mdica (infrmelos a su mdico o a su profesional de la salud si persisten o si son molestos): acn dolor de espalda dolor en las mamas cambios de peso mareos sensacin general de estar enfermo o sntomas gripales dolor de cabeza sangrado menstrual irregular nuseas dolor de garganta irritacin o inflamacin vaginal Puede ser que esta lista no menciona todos los posibles efectos secundarios. Comunquese a su mdico por asesoramiento mdico sobre los efectos secundarios. Usted puede informar los efectos secundarios a la FDA por telfono al 1-800-FDA-1088. Dnde debo guardar mi medicina? Este medicamento se administra en hospitales o clnicas y no necesitar guardarlo en su domicilio. ATENCIN: Este folleto es un resumen. Puede ser que no cubra toda la posible informacin. Si usted tiene preguntas acerca de esta medicina,  consulte con su mdico, su farmacutico o su profesional de la salud.  2018 Elsevier/Gold Standard (2016-11-03 00:00:00)  

## 2018-03-20 LAB — AB SCR+ANTIBODY ID: ANTIBODY SCREEN: POSITIVE — AB

## 2018-03-20 LAB — ANTIBODY SCREEN

## 2018-04-04 ENCOUNTER — Other Ambulatory Visit: Payer: Self-pay

## 2018-04-04 ENCOUNTER — Ambulatory Visit (INDEPENDENT_AMBULATORY_CARE_PROVIDER_SITE_OTHER): Payer: Self-pay | Admitting: Obstetrics and Gynecology

## 2018-04-04 VITALS — BP 103/59 | HR 100 | Wt 208.0 lb

## 2018-04-04 DIAGNOSIS — O09899 Supervision of other high risk pregnancies, unspecified trimester: Secondary | ICD-10-CM

## 2018-04-04 DIAGNOSIS — O09523 Supervision of elderly multigravida, third trimester: Secondary | ICD-10-CM

## 2018-04-04 DIAGNOSIS — O99210 Obesity complicating pregnancy, unspecified trimester: Secondary | ICD-10-CM

## 2018-04-04 DIAGNOSIS — E669 Obesity, unspecified: Secondary | ICD-10-CM | POA: Insufficient documentation

## 2018-04-04 DIAGNOSIS — O09219 Supervision of pregnancy with history of pre-term labor, unspecified trimester: Secondary | ICD-10-CM

## 2018-04-04 DIAGNOSIS — O360930 Maternal care for other rhesus isoimmunization, third trimester, not applicable or unspecified: Secondary | ICD-10-CM

## 2018-04-04 DIAGNOSIS — O0990 Supervision of high risk pregnancy, unspecified, unspecified trimester: Secondary | ICD-10-CM

## 2018-04-04 DIAGNOSIS — Z789 Other specified health status: Secondary | ICD-10-CM

## 2018-04-04 DIAGNOSIS — O99013 Anemia complicating pregnancy, third trimester: Secondary | ICD-10-CM

## 2018-04-04 NOTE — Patient Instructions (Signed)

## 2018-04-04 NOTE — Progress Notes (Signed)
Prenatal Visit Note Date: 04/04/2018 Clinic: [facility]  Subjective:  Ruth Stewart is a 37 y.o. [id number] at 4957w2d being seen today for ongoing prenatal care.  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  Patient reports no complaints.   Contractions: Irregular. Vag. Bleeding: Scant.  Movement: Present. Denies leaking of fluid.   The following portions of the patient's history were reviewed and updated as appropriate: allergies, current medications, past family history, past medical history, past social history, past surgical history and problem list. Problem list updated.  Objective:   Vitals:   04/04/18 1345  BP: (!) 103/59  Pulse: 100  Weight: 208 lb (94.3 kg)    Fetal Status: Fetal Heart Rate (bpm): 150 Fundal Height: 33 cm Movement: Present  Presentation: Vertex  General:  Alert, oriented and cooperative. Patient is in no acute distress.  Skin: Skin is warm and dry. No rash noted.   Cardiovascular: Normal heart rate noted  Respiratory: Normal respiratory effort, no problems with respiration noted  Abdomen: Soft, gravid, appropriate for gestational age. Pain/Pressure: Present     Pelvic:  Cervical exam deferred        Extremities: Normal range of motion.  Edema: Trace  Mental Status: Normal mood and affect. Normal behavior. Normal judgment and thought content.   Urinalysis:      Assessment and Plan:  Pregnancy: [id number] at 6057w2d  1. Multigravida of advanced maternal age in third trimester Declined genetics. No current issues.  Had an elevated afp.  2. 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 Supervision of high risk pregnancy, antepartum Routine care. Recommend h2o2 for ear wax build up  4. Language barrier Interpreter used  5. H/O preterm delivery, currently pregnant Sounds medically indicated. Declined 17p  6. Obesity affecting pregnancy, antepartum  7. BMI 30s  8. Anemia during pregnancy in third trimester Pt confirms on bid iron. Recommend checking levels nv.   Preterm labor symptoms and general obstetric precautions including but not limited to vaginal bleeding, contractions, leaking of fluid and fetal movement were reviewed in detail with the patient. Please refer to After Visit Summary for other counseling recommendations.  Return in about 2 weeks (around 04/18/2018) for hrob.   Sedalia BingPickens, Braxton Vantrease, MD

## 2018-04-23 ENCOUNTER — Ambulatory Visit (INDEPENDENT_AMBULATORY_CARE_PROVIDER_SITE_OTHER): Payer: Self-pay | Admitting: Obstetrics & Gynecology

## 2018-04-23 VITALS — BP 112/60 | HR 87 | Wt 206.9 lb

## 2018-04-23 DIAGNOSIS — O360930 Maternal care for other rhesus isoimmunization, third trimester, not applicable or unspecified: Secondary | ICD-10-CM

## 2018-04-23 DIAGNOSIS — O09523 Supervision of elderly multigravida, third trimester: Secondary | ICD-10-CM

## 2018-04-23 DIAGNOSIS — D509 Iron deficiency anemia, unspecified: Secondary | ICD-10-CM

## 2018-04-23 DIAGNOSIS — Z113 Encounter for screening for infections with a predominantly sexual mode of transmission: Secondary | ICD-10-CM

## 2018-04-23 DIAGNOSIS — O09219 Supervision of pregnancy with history of pre-term labor, unspecified trimester: Secondary | ICD-10-CM

## 2018-04-23 DIAGNOSIS — O0990 Supervision of high risk pregnancy, unspecified, unspecified trimester: Secondary | ICD-10-CM

## 2018-04-23 DIAGNOSIS — O09213 Supervision of pregnancy with history of pre-term labor, third trimester: Secondary | ICD-10-CM

## 2018-04-23 DIAGNOSIS — O09899 Supervision of other high risk pregnancies, unspecified trimester: Secondary | ICD-10-CM

## 2018-04-23 DIAGNOSIS — O99013 Anemia complicating pregnancy, third trimester: Secondary | ICD-10-CM

## 2018-04-23 NOTE — Patient Instructions (Addendum)
Regrese a la clinica cuando tenga su cita. Si tiene problemas o preguntas, llama a la clinica o vaya a la sala de emergencia al [facility].    

## 2018-04-23 NOTE — Progress Notes (Signed)
   PRENATAL VISIT NOTE  Subjective:  Bud FaceSandra Pena Stewart is a 37 y.o. [id number] at 3080w0d being seen today for ongoing prenatal care.  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  Patient reports occasional contractions.  Contractions: Irregular. Vag. Bleeding: None.  Movement: Present. Denies leaking of fluid.   The following portions of the patient's history were reviewed and updated as appropriate: allergies, current medications, past family history, past medical history, past social history, past surgical history and problem list. Problem list updated.  Objective:   Vitals:   04/23/18 1120  BP: 112/60  Pulse: 87  Weight: 206 lb 14.4 oz (93.8 kg)    Fetal Status: Fetal Heart Rate (bpm): 140 Fundal Height: 37 cm Movement: Present  Presentation: Vertex  General:  Alert, oriented and cooperative. Patient is in no acute distress.  Skin: Skin is warm and dry. No rash noted.   Cardiovascular: Normal heart rate noted  Respiratory: Normal respiratory effort, no problems with respiration noted  Abdomen: Soft, gravid, appropriate for gestational age.  Pain/Pressure: Present     Pelvic: Cervical exam performed Dilation: 2 Effacement (%): 50 Station: Ballotable  Extremities: Normal range of motion.  Edema: Mild pitting, slight indentation  Mental Status: Normal mood and affect. Normal behavior. Normal judgment and thought content.   Assessment and Plan:  Pregnancy: [id number] at 10580w0d  1. Anemia during pregnancy in third trimester CBC Latest Ref Rng & Units 02/26/2018  11/23/2017 09/27/2012  WBC 3.4 - 10.8 x10E3/uL 7.8 - 10.5  Hemoglobin 11.1 - 15.9 g/dL 7.9(L) 10.2 10.3(L)  Hematocrit 34.0 - 46.6 % 26.1(L) 31 32.2(L)  Platelets 150 - 379 x10E3/uL 337 271 203  On iron therapy - Vitamin B12 - Folate - Iron and TIBC - Ferritin - CBC Will follow up results and manage accordingly.  2. 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  Last titer on 03/19/18 was too weak to titer.  Had long discussion with patient about RBC antibodies and isoimmunization; she did not understand initially why we were checking this.   3. H/O preterm delivery, currently pregnant She is term now!!  4. Multigravida of advanced maternal age in third trimester Stable  5. Supervision of high risk pregnancy, antepartum Pelvic cultures done today. - Strep Gp B NAA - Cervicovaginal ancillary only  Term labor symptoms and general obstetric precautions including but not limited to vaginal bleeding, contractions, leaking of fluid and fetal movement were reviewed in detail with the patient. Please refer to After Visit Summary for other counseling recommendations.  Return in about 1 week (around 04/30/2018) for OB Visit (HOB).   Jaynie CollinsUgonna Tama Grosz, MD

## 2018-04-24 LAB — CBC
HEMATOCRIT: 27 % — AB (ref 34.0–46.6)
HEMOGLOBIN: 8.3 g/dL — AB (ref 11.1–15.9)
MCH: 23.4 pg — AB (ref 26.6–33.0)
MCHC: 30.7 g/dL — AB (ref 31.5–35.7)
MCV: 76 fL — AB (ref 79–97)
Platelets: 293 10*3/uL (ref 150–450)
RBC: 3.54 x10E6/uL — ABNORMAL LOW (ref 3.77–5.28)
RDW: 22.9 % — ABNORMAL HIGH (ref 12.3–15.4)
WBC: 7.1 10*3/uL (ref 3.4–10.8)

## 2018-04-24 LAB — CERVICOVAGINAL ANCILLARY ONLY
Bacterial vaginitis: NEGATIVE
CANDIDA VAGINITIS: NEGATIVE
Chlamydia: NEGATIVE
Neisseria Gonorrhea: NEGATIVE
TRICH (WINDOWPATH): NEGATIVE

## 2018-04-24 LAB — AB SCR+ANTIBODY ID
ANTIBODY SCREEN: POSITIVE — AB
COOMBS TITER #1: 8

## 2018-04-24 LAB — ANTIBODY SCREEN

## 2018-04-24 LAB — FOLATE: FOLATE: 15.7 ng/mL (ref >3.0)

## 2018-04-24 LAB — IRON AND TIBC
Iron Saturation: 3 % — CL (ref 15–55)
Iron: 21 ug/dL — ABNORMAL LOW (ref 27–159)
TIBC: 613 ug/dL — AB (ref 250–450)
UIBC: 592 ug/dL — AB (ref 131–425)

## 2018-04-24 LAB — FERRITIN: Ferritin: 7 ng/mL — ABNORMAL LOW (ref 15–150)

## 2018-04-24 LAB — VITAMIN B12: VITAMIN B 12: 309 pg/mL (ref 232–1245)

## 2018-04-25 ENCOUNTER — Telehealth: Payer: Self-pay | Admitting: General Practice

## 2018-04-25 ENCOUNTER — Encounter: Payer: Self-pay | Admitting: Obstetrics & Gynecology

## 2018-04-25 LAB — STREP GP B NAA: STREP GROUP B AG: NEGATIVE

## 2018-04-25 NOTE — Progress Notes (Signed)
Patient has iron deficiency anemia in the third trimester. Has been on oral iron, this has not been been sufficient.  She needs IV iron infusion ( 2 doses a week apart). Orders placed. Please contact [facility] to set up infusion time for patient. Please call to inform patient of results and recommendations, she is Spanish speaking only.

## 2018-04-25 NOTE — Telephone Encounter (Signed)
Called patient to inform her of work in ultrasound appt tomorrow with pacific interpreter # [phone]- no answer. Left message stating you have an ultrasound appt that has been scheduled for tomorrow 7/11 @ 1:45. You may call us back if you have questions.

## 2018-04-25 NOTE — Addendum Note (Signed)
Addended by: Jaynie CollinsANYANWU, Kristan Brummitt A on: 04/25/2018 08:32 AM   Modules accepted: Orders

## 2018-04-25 NOTE — Addendum Note (Signed)
Addended by: Jaynie CollinsANYANWU, Tennelle Taflinger A on: 04/25/2018 03:19 PM   Modules accepted: Orders

## 2018-04-26 ENCOUNTER — Telehealth: Payer: Self-pay | Admitting: General Practice

## 2018-04-26 ENCOUNTER — Ambulatory Visit (HOSPITAL_COMMUNITY)
Admission: RE | Admit: 2018-04-26 | Discharge: 2018-04-26 | Disposition: A | Discharge status: Home / Self Care | Payer: Self-pay | Source: Ambulatory Visit | Attending: Obstetrics & Gynecology | Admitting: Obstetrics & Gynecology

## 2018-04-26 ENCOUNTER — Encounter (HOSPITAL_COMMUNITY): Payer: Self-pay

## 2018-04-26 DIAGNOSIS — O09899 Supervision of other high risk pregnancies, unspecified trimester: Secondary | ICD-10-CM

## 2018-04-26 DIAGNOSIS — O99013 Anemia complicating pregnancy, third trimester: Secondary | ICD-10-CM

## 2018-04-26 DIAGNOSIS — O289 Unspecified abnormal findings on antenatal screening of mother: Secondary | ICD-10-CM | POA: Insufficient documentation

## 2018-04-26 DIAGNOSIS — Z362 Encounter for other antenatal screening follow-up: Secondary | ICD-10-CM | POA: Insufficient documentation

## 2018-04-26 DIAGNOSIS — R772 Abnormality of alphafetoprotein: Secondary | ICD-10-CM

## 2018-04-26 DIAGNOSIS — O0990 Supervision of high risk pregnancy, unspecified, unspecified trimester: Secondary | ICD-10-CM

## 2018-04-26 DIAGNOSIS — O99210 Obesity complicating pregnancy, unspecified trimester: Secondary | ICD-10-CM

## 2018-04-26 DIAGNOSIS — Z3A37 37 weeks gestation of pregnancy: Secondary | ICD-10-CM | POA: Insufficient documentation

## 2018-04-26 DIAGNOSIS — O2610 Low weight gain in pregnancy, unspecified trimester: Secondary | ICD-10-CM

## 2018-04-26 DIAGNOSIS — O360920 Maternal care for other rhesus isoimmunization, second trimester, not applicable or unspecified: Secondary | ICD-10-CM

## 2018-04-26 DIAGNOSIS — D509 Iron deficiency anemia, unspecified: Secondary | ICD-10-CM

## 2018-04-26 DIAGNOSIS — O09219 Supervision of pregnancy with history of pre-term labor, unspecified trimester: Secondary | ICD-10-CM

## 2018-04-26 DIAGNOSIS — O09523 Supervision of elderly multigravida, third trimester: Secondary | ICD-10-CM | POA: Insufficient documentation

## 2018-04-26 DIAGNOSIS — O360930 Maternal care for other rhesus isoimmunization, third trimester, not applicable or unspecified: Secondary | ICD-10-CM | POA: Insufficient documentation

## 2018-04-26 DIAGNOSIS — Z789 Other specified health status: Secondary | ICD-10-CM

## 2018-04-26 NOTE — Telephone Encounter (Signed)
Patient has ultrasound today at 1:45pm. Called patient with Ruth PerkingMaria Stewart for interpreter & informed her of results & ultrasound appt today. Patient verbalized understanding & had no questions.

## 2018-04-26 NOTE — Telephone Encounter (Signed)
-----   Message from Tereso NewcomerUgonna A Anyanwu, MD sent at 04/25/2018  3:36 PM EDT ----- Increase in titer to 1:8 (was too weak to titer last month). Discussed with Dr. Judeth CornfieldShankar (MFM), he recommended growth scan and MCA doppler. These were ordered. Please call to inform patient of results and recommendations; Spanish speaking. Will follow up MFM recommendations.  Jaynie CollinsUgonna Anyanwu, MD

## 2018-04-27 ENCOUNTER — Other Ambulatory Visit (HOSPITAL_COMMUNITY): Payer: Self-pay | Admitting: *Deleted

## 2018-04-27 DIAGNOSIS — O360930 Maternal care for other rhesus isoimmunization, third trimester, not applicable or unspecified: Secondary | ICD-10-CM

## 2018-05-03 ENCOUNTER — Other Ambulatory Visit (HOSPITAL_COMMUNITY): Payer: Self-pay | Admitting: Obstetrics and Gynecology

## 2018-05-03 ENCOUNTER — Telehealth (HOSPITAL_COMMUNITY): Payer: Self-pay | Admitting: *Deleted

## 2018-05-03 ENCOUNTER — Encounter (HOSPITAL_COMMUNITY): Payer: Self-pay

## 2018-05-03 ENCOUNTER — Ambulatory Visit (INDEPENDENT_AMBULATORY_CARE_PROVIDER_SITE_OTHER): Payer: Self-pay | Admitting: Obstetrics and Gynecology

## 2018-05-03 ENCOUNTER — Ambulatory Visit (HOSPITAL_COMMUNITY)
Admission: RE | Admit: 2018-05-03 | Discharge: 2018-05-03 | Disposition: A | Discharge status: Home / Self Care | Payer: Self-pay | Source: Ambulatory Visit | Attending: Obstetrics & Gynecology | Admitting: Obstetrics & Gynecology

## 2018-05-03 VITALS — BP 115/73 | HR 76 | Wt 209.0 lb

## 2018-05-03 DIAGNOSIS — O09523 Supervision of elderly multigravida, third trimester: Secondary | ICD-10-CM

## 2018-05-03 DIAGNOSIS — O289 Unspecified abnormal findings on antenatal screening of mother: Secondary | ICD-10-CM

## 2018-05-03 DIAGNOSIS — O0990 Supervision of high risk pregnancy, unspecified, unspecified trimester: Secondary | ICD-10-CM

## 2018-05-03 DIAGNOSIS — O360930 Maternal care for other rhesus isoimmunization, third trimester, not applicable or unspecified: Secondary | ICD-10-CM

## 2018-05-03 DIAGNOSIS — Z3A38 38 weeks gestation of pregnancy: Secondary | ICD-10-CM

## 2018-05-03 DIAGNOSIS — O0993 Supervision of high risk pregnancy, unspecified, third trimester: Secondary | ICD-10-CM

## 2018-05-03 NOTE — Telephone Encounter (Signed)
Preadmission screen Interpreter number [phone]

## 2018-05-03 NOTE — Progress Notes (Signed)
Pt stated having pain when standing/walking and baby feels heavy.

## 2018-05-03 NOTE — Progress Notes (Signed)
Prenatal Visit Note Date: 05/03/2018 Clinic: [facility]  Subjective:  Ruth Stewart is a 37 y.o. [id number] at 7865w3d being seen today for ongoing prenatal care.  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  Patient reports no complaints.   Contractions: Irregular. Vag. Bleeding: None.  Movement: Present. Denies leaking of fluid.   The following portions of the patient's history were reviewed and updated as appropriate: allergies, current medications, past family history, past medical history, past social history, past surgical history and problem list. Problem list updated.  Objective:   Vitals:   05/03/18 1344  BP: 115/73  Pulse: 76  Weight: 209 lb (94.8 kg)    Fetal Status: Fetal Heart Rate (bpm): 156   Movement: Present  Presentation: Vertex  General:  Alert, oriented and cooperative. Patient is in no acute distress.  Skin: Skin is warm and dry. No rash noted.   Cardiovascular: Normal heart rate noted  Respiratory: Normal respiratory effort, no problems with respiration noted  Abdomen: Soft, gravid, appropriate for gestational age. Pain/Pressure: Present     Pelvic:  Cervical exam performed Dilation: 2.5 Effacement (%): 50 Station: Ballotable  Extremities: Normal range of motion.     Mental Status: Normal mood and affect. Normal behavior. Normal judgment and thought content.   Urinalysis:      Assessment and Plan:  Pregnancy: [id number] at 6065w3d  1. Supervision of  high risk pregnancy, antepartum AFI and MCA dopplers normal today but given situation, I recommended 39wk IOL which pt is amenable to. Next slot is in one week.   Interpreter used  Term labor symptoms and general obstetric precautions including but not limited to vaginal bleeding, contractions, leaking of fluid and fetal movement were reviewed in detail with the patient. Please refer to After Visit Summary for other counseling recommendations.  Return in about 6 days (around 05/09/2018) for hrob and 4wk pp visit.   [place] BingPickens, Rishit Burkhalter, MD

## 2018-05-09 ENCOUNTER — Encounter: Payer: Self-pay | Admitting: Obstetrics and Gynecology

## 2018-05-09 ENCOUNTER — Ambulatory Visit (INDEPENDENT_AMBULATORY_CARE_PROVIDER_SITE_OTHER): Payer: Self-pay | Admitting: Obstetrics and Gynecology

## 2018-05-09 VITALS — BP 107/67 | HR 70 | Wt 211.8 lb

## 2018-05-09 DIAGNOSIS — O0990 Supervision of high risk pregnancy, unspecified, unspecified trimester: Secondary | ICD-10-CM

## 2018-05-09 DIAGNOSIS — Z789 Other specified health status: Secondary | ICD-10-CM

## 2018-05-09 DIAGNOSIS — O09523 Supervision of elderly multigravida, third trimester: Secondary | ICD-10-CM

## 2018-05-09 DIAGNOSIS — O360930 Maternal care for other rhesus isoimmunization, third trimester, not applicable or unspecified: Secondary | ICD-10-CM

## 2018-05-09 NOTE — Patient Instructions (Signed)
Parto vaginal Vaginal Delivery Parto vaginal significa que usted dar a luz empujando al beb fuera del canal del parto (vagina). Un equipo de proveedores de atencin mdica la ayudar antes, durante y despus del parto vaginal. Las experiencias de los nacimientos son nicas para todas las mujeres y cada embarazo y las experiencias de nacimiento varan segn dnde elija dar a luz. Qu debo hacer a fin de prepararme para el nacimiento de mi beb? Antes del nacimiento de su beb, es importante que hable con su mdico sobre lo siguiente:  Sus preferencias en cuanto al trabajo de parto y parto. Estas pueden incluir lo siguiente: ? Medicamentos que le puedan administrar. ? Cmo controlar el dolor. Esto podra incluir tcnicas de alivio del dolor no mdicas o medicamentos inyectables para aliviar el dolor como la analgesia epidural. ? Cmo se los controlar a usted y a su beb durante el trabajo de parto y el parto. ? Quin puede estar en la sala de trabajo de parto y parto con usted. ? Sus opiniones en cuanto al parto quirrgico de su beb (parto por cesrea o cesrea) si esto fuera necesario. ? Sus opiniones en cuanto a recibir sangre donada a travs de un tubo (catter) intravenoso (transfusin de sangre) si esto fuera necesario.  Si usted puede: ? Tomar fotografas o videos del nacimiento. ? Comer durante el trabajo de parto y el parto. ? Moverse, caminar o cambiar de posicin durante el trabajo de parto y el parto.  Qu esperar despus del nacimiento de su beb, como: ? Si se ofrece el pinzamiento y corte tardo del cordn umbilical. ? Quin cuidar a su beb inmediatamente despus del nacimiento. ? Medicamentos o pruebas que pueden recomendarse para su beb. ? Si su hospital o centro de parto apoya la lactancia. ? Cunto tiempo estar en el hospital o en el centro de parto.  Cmo cualquier afeccin mdica que usted tenga puede afectar a su beb o la experiencia de trabajo de parto y  parto.  A fin de prepararse para el nacimiento de su beb, usted tambin debe:  Asistir a todas sus visitas de atencin mdica antes del parto (visitas prenatales) segn lo recomendado por su mdico. Esto es importante.  Preparacin del hogar para la llegada de su beb. Asegrese de tener lo siguiente: ? Paales. ? Ropa de beb. ? Equipo de alimentacin. ? Haga preparativos para que usted y su beb puedan dormir de manera segura.  Instale un asiento de beb en su vehculo. Haga verificar el asiento de beb de su coche por un instalador de asientos de beb para asegurarse de que est instalado en forma segura.  Piense en quin la ayudar con su nuevo beb en el hogar durante al menos las primeras semanas despus del parto.  Qu puedo esperar cuando llegue al centro de parto o el hospital? Una vez que se inicie el trabajo de parto y haya sido admitida en el hospital o centro de parto, el mdico podr hacer lo siguiente:  Revisar sus antecedentes de embarazo y cualquier inquietud que usted pueda tener.  Colocar un tubo (catter) intravenoso en una de sus venas. Esto se usa para administrarle lquidos y medicamentos.  Verificar su presin arterial, temperatura y pulso y la frecuencia cardaca (signos vitales).  Verificar si la bolsa de agua (saco amnitico) se ha roto (ruptura).  Hablar con usted sobre su plan de nacimiento y analizar las opciones para controlar el dolor.  Monitoreo Su mdico puede monitorear las contracciones (monitoreo uterino) y   el ritmo cardaco del beb (monitoreo fetal). Es posible que el monitoreo se necesite realizar:  Con frecuencia, pero no continuamente (intermitentemente).  Todo el tiempo o durante largos perodos a la vez (continuamente). El monitoreo continuo puede ser necesario si: ? Usted est recibiendo determinados medicamentos, tales como medicamentos para aliviar el dolor o para hacer que las contracciones ms fuertes. ? Tiene complicaciones  durante el embarazo o el trabajo de parto.  El monitoreo se puede realizar:  Al colocar un estetoscopio especial o un dispositivo manual de monitoreo en el abdomen o verificar los latidos cardacos de su beb y sentir su abdomen para controlar de contracciones. Este mtodo de monitoreo no registra los latidos cardacos de su beb ni sus contracciones de manera continua.  Colocar monitores en el abdomen (monitores externos) para registrar los latidos cardacos de su beb y la frecuencia y duracin de las contracciones. No tendr que tener colocados los monitores externos en todo momento.  Colocar monitores dentro del tero (monitores internos) para registrar los latidos cardacos de su beb y la frecuencia, duracin y fuerza de sus contracciones. ? Su mdico podr usar monitores internos si necesita ms informacin sobre la fuerza de las contracciones o la frecuencia cardaca del beb. ? Los monitores internos se colocan pasando un cable delgado y flexible a travs de la vagina hasta el tero. Segn el tipo de monitor, puede permanecer en el tero o en la cabeza del beb hasta el nacimiento. ? Su mdico analizar con usted los beneficios y los riesgos de usar un monitor interno y le pedir permiso antes de colocar los monitores.  Telemetra. Se trata de un tipo de monitoreo continuo que se puede realizar con monitores externos o internos. En lugar de tener que permanecer en la cama, usted puede moverse durante la telemetra. Pregunte a su mdico si la telemetra es una opcin para usted.  Examen fsico. Su mdico puede realizarle un examen fsico. Esto puede incluir lo siguiente:  Verificar en qu posicin se encuentra su beb: ? Con la cabeza hacia la vagina (cabeza abajo). Esta es la posicin ms frecuente. ? Con la cabeza hacia la parte superior del tero (cabeza arriba o de nalgas). Si su beb est en una posicin de nalgas, su mdico puede tratar de hacerlo girar para que quede cabeza abajo a  fin de poder tener un parto vaginal. Si parece que su beb no puede nacer con parto vaginal, su mdico puede recomendar una ciruga para dar a luz al beb. En casos muy poco frecuentes, usted puede dar a luz con parto vaginal si el beb est cabeza arriba (parto de nalgas). ? Posicin lateral (transversal). Los bebs que estn en posicin lateral no pueden nacer por parto vaginal.  Verificar el cuello uterino para determinar: ? Si se est afinando o estirando (borrando). ? Si se est abriendo (dilatando). ? Cunto se ha movido o ha bajado su beb por el canal del parto.  Cules son las tres etapas del trabajo de parto y el parto?  El trabajo de parto y el parto normales se dividen en tres etapas: Etapa 1  La etapa 1 es la etapa ms larga del trabajo de parto y puede durar horas o das. La etapa 1 incluye: ? Trabajo de parto temprano. Esto es cuando las contracciones pueden ser irregulares o regulares y leves. En general, las contracciones del trabajo de parto temprano se producen con ms de 10 minutos de diferencia. ? Trabajo de parto activo. Esto es cuando las   contracciones son ms largas, ms regulares, ms frecuentes y ms intensas. ? La fase de transicin. Esto es cuando las contracciones ocurren muy seguidas, son muy intensas y pueden durar ms que durante cualquier otra parte del trabajo de parto.  En general, las contracciones son leves, infrecuentes e irregulares al principio. Se hacen ms fuertes, ms frecuentes (aproximadamente cada 2 o 3 minutos) y ms regulares a medida que usted avanza desde un trabajo de parto temprano hasta un trabajo de parto activo y fase de transicin.  Muchas mujeres progresan a travs de la etapa 1 de forma natural, pero es posible que usted necesite ayuda para continuar avanzando. Si esto ocurre, su mdico puede hablar con usted sobre lo siguiente: ? La ruptura del saco amnitico si es que an no ha ocurrido. ? Administracin de medicamentos para ayudarla  a tener contracciones ms fuertes y ms frecuentes.  La etapa 1 finaliza cuando el cuello uterino est completamente dilatado hasta 4 pulgadas (10cm) y completamente borrado. Esto ocurre al final de la fase de transicin. Etapa 2  Una vez que el cuello uterino est totalmente borrado y dilatado a 4 pulgadas (10cm), usted puede comenzar a sentir ganas de pujar. Es comn que el cuerpo tome un descanso de manera natural antes de sentir ganas de pujar, especialmente si recibe una epidural u otros medicamentos para el dolor. Este perodo de descanso puede durar un mximo de 1 a 2 horas, segn su experiencia de parto nica.  Durante la etapa 2, las contracciones son generalmente menos doloras porque pujar ayuda a aliviar el dolor de las contracciones. En lugar del dolor de las contracciones, puede sentir un dolor urente y por estiramiento, especialmente cuando la parte ms ancha de la cabeza de su beb pasa a travs de la abertura vaginal (coronacin).  Su mdico controlar atentamente su avance con los pujos y el avance del beb a travs de la vagina durante la etapa 2.  Su mdico puede masajear el rea de la piel entre la abertura vaginal y el ano (perineo) o aplicar compresas tibias en el perineo. Esto ayuda al estiramiento ya que la cabeza del beb empieza a aparecer, lo cual puede ayudar a evitar un desgarro perineal. ? En algunos casos, se puede hacer una incisin en el peritoneo (episiotoma) para permitir que el beb pase a travs de la abertura vaginal. La episiotoma sirve para agrandar la abertura vaginal a fin de que el beb tenga ms espacio para pasar durante el parto.  Es muy importante respirar y concentrarse para que el mdico pueda controlar la salida de la cabeza del beb. Es posible que su mdico tenga que disminuir la intensidad de los pujos para ayudar a evitar un desgarro perineal.  Despus de que sale la cabeza del beb, en general salen los hombros y el resto del cuerpo muy  rpidamente y sin dificultad.  Una vez que el beb nace, se debe cortar el cordn umbilical de inmediato o esto puede demorar 1 o 2 minutos, segn la salud del beb. Este procedimiento puede variar segn el mdico, el hospital y el centro de parto.  Si usted y su beb estn lo suficientemente sanos, se le colocar el beb en el pecho o el abdomen para ayudar a mantener la temperatura del beb y el vnculo entre ustedes. Algunas madres y bebs comienzan la lactancia en este momento. Su equipo mdico secar al beb y lo ayudar a mantenerse caliente durante este tiempo.  Es posible que su beb necesite atencin   inmediata si: ? Mostr signos de sufrimiento durante el trabajo de parto. ? Tiene una afeccin mdica. ? Naci antes de tiempo (prematuro). ? Defeca antes del nacimiento (meconio). ? Muestra signos de dificultar en la transicin de estar dentro del tero a estar fuera del tero. Si tiene planeado amamantar, su equipo mdico la ayudar a comenzar la lactancia. Etapa 3  La tercera etapa del trabajo de parto comienza inmediatamente despus del nacimiento de su beb y finaliza despus de la expulsin de la placenta. La placenta es un rgano de que desarrolla durante el embarazo para proporcionar oxgeno y nutrientes al beb en el tero.  La expulsin de la placenta puede requerir algunos pujos y es posible que usted tenga contracciones leves. La lactancia puede estimular las contracciones para ayudar a expulsar la placenta.  Luego de la expulsin de la placenta, el tero debe (contraerse) y quedar muy firme. Esto ayuda a detener el sangrado en el tero. Para ayudar al tero a contraerse y controlar el sangrado, su mdico puede: ? Administrarle un medicamento inyectable, a travs de un tubo (catter) intravenoso, por boca o a travs del recto (por va rectal). ? Masajear el abdomen o realizar un examen de la vagina para extraer cualquier cogulo de sangre que quede en el tero. ? Vaciar la  vejiga colocando un tubo flexible (catter) en la vejiga. ? Alentarla a amamantar a su beb. Una vez que termina el trabajo de parto, se los controlar a usted y a su beb atentamente para tener la seguridad de que ambos estn sanos hasta que estn listos para ir a casa. Su equipo de atencin mdica le ensear cmo cuidarse y cuidar a su beb. Esta informacin no tiene como fin reemplazar el consejo del mdico. Asegrese de hacerle al mdico cualquier pregunta que tenga. Document Released: 09/15/2008 Document Revised: 01/12/2017 Document Reviewed: 10/18/2015 Elsevier Interactive Patient Education  2018 [employer]  

## 2018-05-09 NOTE — Progress Notes (Signed)
Subjective:  Ruth Stewart is a 37 y.o. [id number] at 319w2d being seen today for ongoing prenatal care.  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  Patient reports no complaints.  Contractions: Irregular. Vag. Bleeding: None.  Movement: Present. Denies leaking of fluid.   The following portions of the patient's history were reviewed and updated as appropriate: allergies, current medications, past family history, past medical history, past social history, past surgical history and problem list. Problem list updated.  Objective:   Vitals:   05/09/18 1518  BP: 107/67  Pulse: 70  Weight: 211 lb 12.8 oz (96.1 kg)    Fetal Status: Fetal Heart Rate (bpm): 134   Movement: Present     General:  Alert, oriented and cooperative. Patient is in no acute distress.  Skin: Skin is warm and dry. No rash noted.   Cardiovascular: Normal heart rate noted  Respiratory: Normal respiratory effort, no problems with respiration noted  Abdomen: Soft, gravid, appropriate for gestational age. Pain/Pressure: Present     Pelvic:  Cervical exam deferred        Extremities: Normal range of motion.  Edema: Mild pitting, slight indentation  Mental Status: Normal mood and affect. Normal behavior. Normal judgment and thought content.   Urinalysis:      Assessment and Plan:  Pregnancy: [id number] at 2919w2d  1. Supervision of high risk pregnancy, antepartum Stable IOL tomorrow at 7:30 AM  2. Antibody E isoimmunization affecting  pregnancy in third trimester, single or unspecified fetus   3. Multigravida of advanced maternal age in third trimester   4. Language barrier Video interrupter used during today's visit  Term labor symptoms and general obstetric precautions including but not limited to vaginal bleeding, contractions, leaking of fluid and fetal movement were reviewed in detail with the patient. Please refer to After Visit Summary for other counseling recommendations.  Return in about 5 weeks (around 06/13/2018).   Hermina StaggersErvin, Ruth Mehl L, MD

## 2018-05-10 ENCOUNTER — Encounter (HOSPITAL_COMMUNITY): Payer: Self-pay

## 2018-05-10 ENCOUNTER — Inpatient Hospital Stay (HOSPITAL_COMMUNITY)
Admission: RE | Admit: 2018-05-10 | Discharge: 2018-05-12 | DRG: 807 | Disposition: A | Discharge status: Home / Self Care | Payer: Medicaid Other | Attending: Obstetrics and Gynecology | Admitting: Obstetrics and Gynecology

## 2018-05-10 DIAGNOSIS — Z3A39 39 weeks gestation of pregnancy: Secondary | ICD-10-CM

## 2018-05-10 DIAGNOSIS — O361930 Maternal care for other isoimmunization, third trimester, not applicable or unspecified: Principal | ICD-10-CM | POA: Diagnosis present

## 2018-05-10 DIAGNOSIS — O9081 Anemia of the puerperium: Secondary | ICD-10-CM | POA: Diagnosis not present

## 2018-05-10 LAB — CBC
HEMATOCRIT: 27.8 % — AB (ref 36.0–46.0)
HEMOGLOBIN: 8.6 g/dL — AB (ref 12.0–15.0)
MCH: 23.9 pg — ABNORMAL LOW (ref 26.0–34.0)
MCHC: 30.9 g/dL (ref 30.0–36.0)
MCV: 77.2 fL — AB (ref 78.0–100.0)
Platelets: 282 10*3/uL (ref 150–400)
RBC: 3.6 MIL/uL — AB (ref 3.87–5.11)
RDW: 23.4 % — ABNORMAL HIGH (ref 11.5–15.5)
WBC: 8.8 10*3/uL (ref 4.0–10.5)

## 2018-05-10 LAB — RPR: RPR Ser Ql: NONREACTIVE

## 2018-05-10 LAB — PREPARE RBC (CROSSMATCH)

## 2018-05-10 MED ORDER — LIDOCAINE HCL (PF) 1 % IJ SOLN
30.0000 mL | INTRAMUSCULAR | Status: DC | PRN
  Filled 2018-05-10: qty 30

## 2018-05-10 MED ORDER — ACETAMINOPHEN 325 MG PO TABS: 650.0000 mg | ORAL_TABLET | ORAL | Status: DC | PRN

## 2018-05-10 MED ORDER — SENNOSIDES-DOCUSATE SODIUM 8.6-50 MG PO TABS
2.0000 | ORAL_TABLET | ORAL | Status: DC
  Administered 2018-05-11 (×2): 2 via ORAL
  Filled 2018-05-10 (×2): qty 2

## 2018-05-10 MED ORDER — SIMETHICONE 80 MG PO CHEW: 80.0000 mg | CHEWABLE_TABLET | ORAL | Status: DC | PRN

## 2018-05-10 MED ORDER — COCONUT OIL OIL: 1.0000 "application " | TOPICAL_OIL | Status: DC | PRN

## 2018-05-10 MED ORDER — FENTANYL CITRATE (PF) 100 MCG/2ML IJ SOLN: 100.0000 ug | INTRAMUSCULAR | Status: DC | PRN

## 2018-05-10 MED ORDER — ZOLPIDEM TARTRATE 5 MG PO TABS: 5.0000 mg | ORAL_TABLET | Freq: Every evening | ORAL | Status: DC | PRN

## 2018-05-10 MED ORDER — BENZOCAINE-MENTHOL 20-0.5 % EX AERO: 1.0000 "application " | INHALATION_SPRAY | CUTANEOUS | Status: DC | PRN

## 2018-05-10 MED ORDER — OXYCODONE-ACETAMINOPHEN 5-325 MG PO TABS: 2.0000 | ORAL_TABLET | ORAL | Status: DC | PRN

## 2018-05-10 MED ORDER — ONDANSETRON HCL 4 MG/2ML IJ SOLN: 4.0000 mg | Freq: Four times a day (QID) | INTRAMUSCULAR | Status: DC | PRN

## 2018-05-10 MED ORDER — MISOPROSTOL 25 MCG QUARTER TABLET
25.0000 ug | ORAL_TABLET | ORAL | Status: DC | PRN
  Filled 2018-05-10 (×2): qty 1

## 2018-05-10 MED ORDER — SODIUM CHLORIDE 0.9% IV SOLUTION: Freq: Once | INTRAVENOUS | Status: DC

## 2018-05-10 MED ORDER — TERBUTALINE SULFATE 1 MG/ML IJ SOLN
0.2500 mg | Freq: Once | INTRAMUSCULAR | Status: DC | PRN
  Filled 2018-05-10: qty 1

## 2018-05-10 MED ORDER — LACTATED RINGERS IV SOLN: 500.0000 mL | INTRAVENOUS | Status: DC | PRN

## 2018-05-10 MED ORDER — METHYLERGONOVINE MALEATE 0.2 MG/ML IJ SOLN: 0.2000 mg | Freq: Once | INTRAMUSCULAR | Status: DC

## 2018-05-10 MED ORDER — WITCH HAZEL-GLYCERIN EX PADS: 1.0000 "application " | MEDICATED_PAD | CUTANEOUS | Status: DC | PRN

## 2018-05-10 MED ORDER — DIBUCAINE 1 % RE OINT: 1.0000 "application " | TOPICAL_OINTMENT | RECTAL | Status: DC | PRN

## 2018-05-10 MED ORDER — IBUPROFEN 600 MG PO TABS
600.0000 mg | ORAL_TABLET | Freq: Four times a day (QID) | ORAL | Status: DC
  Administered 2018-05-10 – 2018-05-12 (×8): 600 mg via ORAL
  Filled 2018-05-10 (×8): qty 1

## 2018-05-10 MED ORDER — OXYTOCIN BOLUS FROM INFUSION
500.0000 mL | Freq: Once | INTRAVENOUS | Status: AC
  Administered 2018-05-10: 500 mL via INTRAVENOUS

## 2018-05-10 MED ORDER — LACTATED RINGERS IV SOLN
INTRAVENOUS | Status: DC
  Administered 2018-05-10: 09:00:00 via INTRAVENOUS

## 2018-05-10 MED ORDER — OXYTOCIN 40 UNITS IN LACTATED RINGERS INFUSION - SIMPLE MED
1.0000 m[IU]/min | INTRAVENOUS | Status: DC
  Administered 2018-05-10: 2 m[IU]/min via INTRAVENOUS
  Filled 2018-05-10: qty 1000

## 2018-05-10 MED ORDER — PRENATAL MULTIVITAMIN CH
1.0000 | ORAL_TABLET | Freq: Every day | ORAL | Status: DC
  Administered 2018-05-11 – 2018-05-12 (×2): 1 via ORAL
  Filled 2018-05-10 (×2): qty 1

## 2018-05-10 MED ORDER — OXYCODONE-ACETAMINOPHEN 5-325 MG PO TABS: 1.0000 | ORAL_TABLET | ORAL | Status: DC | PRN

## 2018-05-10 MED ORDER — TETANUS-DIPHTH-ACELL PERTUSSIS 5-2.5-18.5 LF-MCG/0.5 IM SUSP: 0.5000 mL | Freq: Once | INTRAMUSCULAR | Status: DC

## 2018-05-10 MED ORDER — ACETAMINOPHEN 325 MG PO TABS
650.0000 mg | ORAL_TABLET | ORAL | Status: DC | PRN
  Administered 2018-05-10 – 2018-05-11 (×4): 650 mg via ORAL
  Filled 2018-05-10 (×4): qty 2

## 2018-05-10 MED ORDER — ONDANSETRON HCL 4 MG PO TABS: 4.0000 mg | ORAL_TABLET | ORAL | Status: DC | PRN

## 2018-05-10 MED ORDER — ONDANSETRON HCL 4 MG/2ML IJ SOLN: 4.0000 mg | INTRAMUSCULAR | Status: DC | PRN

## 2018-05-10 MED ORDER — SOD CITRATE-CITRIC ACID 500-334 MG/5ML PO SOLN: 30.0000 mL | ORAL | Status: DC | PRN

## 2018-05-10 MED ORDER — DIPHENHYDRAMINE HCL 25 MG PO CAPS: 25.0000 mg | ORAL_CAPSULE | Freq: Four times a day (QID) | ORAL | Status: DC | PRN

## 2018-05-10 MED ORDER — OXYTOCIN 40 UNITS IN LACTATED RINGERS INFUSION - SIMPLE MED: 2.5000 [IU]/h | INTRAVENOUS | Status: DC

## 2018-05-10 NOTE — H&P (Signed)
HPI: Ruth Stewart is a 37 y.o. year old [phone] female at 9552w3d weeks gestation who presents to L&D for IOL for Anti-c and Anti-E antibodies per MFM.  Live Spanish interpreter used.   Nursing Staff Provider  [facility]  LMP  Language   Spanish Anatomy US  Normal at 32 weeks ([facility])  Flu Vaccine   11/23/17 Genetic Screen  NIPS:   AFP:  Elevated, declined further testing   TDaP vaccine  03/19/2018 Hgb A1C or  GTT Early   86 Third trimester: normal  Rhogam   n/a   LAB RESULTS   Feeding Plan  breast Blood Type A/Positive/-- (02/07 0000)   Contraception  interested in BTL but no insurance, talking to financial Antibody Positive (02/07 0000) Anti-E  Circumcision  female Rubella Immune (02/07 0000)  Pediatrician   Unsure RPR Nonreactive (02/07 0000)   Support Person  Husband, daughter HBsAg Negative (02/07 0000)   Prenatal Classes  offered  HIV Non-reactive (02/07 0000)  BTL Consent N/a - self pay GBS Negative  VBAC Consent   N/a  Pap  2019 negative    Hgb Electro      CF     SMA     Waterbirth  [ ]  Class [ ]  Consent [ ]  CNM visit    OB History    Gravida  7   Para  5   Term  4   Preterm  1   AB  1   Living  5     SAB  1   TAB      Ectopic      Multiple      Live Births  5          Past Medical History:  Diagnosis Date  . Blood transfusion without reported diagnosis   . Cervical dysplasia    Past Surgical History:  Procedure Laterality Date  . LEEP  2011   Family History: family history includes Hypertension in her mother. Social History:  reports that she has never smoked. She has never used smokeless tobacco. She reports that she drank alcohol. She reports that she does not use drugs.     Maternal Diabetes: No Genetic Screening: Abnormal:  Results: Elevated AFP Maternal Ultrasounds/Referrals: Normal Fetal Ultrasounds or other Referrals:  Referred to Materal Fetal Medicine  Maternal Substance Abuse:  No Significant  Maternal Medications:  None Significant Maternal Lab Results:  Lab values include: Group B Strep negative Other Comments:  Declined further testing for elevated AFP  Review of Systems  Constitutional: Negative for chills and fever.  Eyes: Negative for blurred vision.  Gastrointestinal: Negative for abdominal pain.  Genitourinary:       Neg LOF, VB  Neurological: Negative for headaches.   History Dilation: 4 Effacement (%): 70 Station: -2 Exam by:: lee Blood pressure 121/71, pulse 79, temperature 98.6 F (37 C), temperature source Oral, resp. rate 18, height 5\' 1"  (1.549 m), weight 211 lb (95.7 kg), last menstrual period 08/07/2017, unknown if currently breastfeeding. Maternal Exam:  Uterine Assessment: Contraction strength is mild.  Contraction frequency is irregular.   Abdomen: Patient reports no abdominal tenderness. Estimated fetal weight is 4383 gm.   Fetal presentation: vertex  Introitus: Normal vagina.  Vagina is negative for discharge.  Pelvis: adequate for delivery.   Cervix: Cervix evaluated by digital exam.     Fetal Exam Fetal Monitor Review: Mode: ultrasound.   Baseline rate: 135.  Variability: moderate (6-25 bpm).  Pattern: accelerations present and no decelerations.    Fetal State Assessment: Category I - tracings are normal.     Physical Exam  Nursing note and vitals reviewed. Constitutional: She is oriented to person, place, and time. She appears well-developed and well-nourished. No distress.  Eyes: No scleral icterus.  Cardiovascular: Normal rate and regular rhythm.  Respiratory: Effort normal and breath sounds normal. No respiratory distress.  GI: Soft. There is no tenderness.  Genitourinary: Vagina normal. No vaginal discharge found.  Musculoskeletal: She exhibits no edema or tenderness.  Neurological: She is alert and oriented to person, place, and time. She has normal reflexes.  Skin: Skin is warm and dry.  Psychiatric: She has a normal mood  and affect.    Prenatal labs: ABO, Rh: --/--/A POS (07/25 1914) Antibody: POS (07/25 7829) Rubella: Immune (02/07 0000) RPR: Non Reactive (05/13 1012)  HBsAg: Negative (02/07 0000)  HIV: Non Reactive (05/13 1012)  GBS: Negative (07/08 1519)   Assessment: 1. Labor: IOL 2. Fetal Wellbeing: Category I 3. Pain Control: None 4. GBS: Neg 5. 39.3 week IUP 6. S>D, proven to 9 1/2 lb w/out shoulder dystocia  Plan:  1. Admit to BS per consult with MD 2. Routine L&D orders 3. Analgesia/anesthesia PRN  4. Type and cross 2 Units 5. Pitocin  6. Watch labor curve closely  Ruth Stewart 05/10/2018, 12:40 PM

## 2018-05-10 NOTE — Anesthesia Pain Management Evaluation Note (Signed)
  CRNA Pain Management Visit Note  Patient: Ruth Stewart, 37 y.o., female  "Hello [facility] am a [facility]. We have an anesthesia team available at all times to provide care throughout the hospital, including epidural management and anesthesia for C-section. I don't know your plan for the delivery whether it a natural birth, water birth, IV sedation, nitrous supplementation, doula or epidural, but we want to meet your pain goals."   1.Was your pain managed to your expectations on prior hospitalizations?   Yes   2.What is your expectation for pain management during this hospitalization?     Labor support without medications  3.How can we help you reach that goal? unsure  Record the patient's initial score and the patient's pain goal.   Pain: 3  Pain Goal: 10 [facility] wants you to be able to say your pain was always managed very well.  Cephus ShellingBURGER,Ruth Stewart 05/10/2018

## 2018-05-10 NOTE — Progress Notes (Signed)
I was present during the delivery with Dr Earlene PlaterWallace, Dorathy KinsmanVirginia Smith CNM, by Orlan LeavensViria Alvarez Spanish Interpreter.

## 2018-05-10 NOTE — Progress Notes (Signed)
Interpreter [phone] used for admission and baby safety information. Pain assessed and tylenol given. Patient eating at time of admission and wants to finish meal before fundal/ VS recheck done. Patients questions answered via translator and verbalizes understanding of info reviewed. Feeding sheet reviewed. Significant other and older daughter present in room.

## 2018-05-10 NOTE — Progress Notes (Signed)
Patient ID: Ruth Stewart, female   DOB: [birth year], 37 y.o.   MRN: [id number] Ruth Stewart is a 37 y.o. [id number] at 576w3d.  Subjective: Mild cramping w/ UC's  Objective: BP 121/74   Pulse 82   Temp 98.6 F (37 C) (Oral)   Resp 18   Ht 5\' 1"  (1.549 m)   Wt 211 lb (95.7 kg)   LMP 08/07/2017   BMI 39.87 kg/m    FHT:  FHR: 135 bpm, variability: mod,  accelerations:  15x15,  decelerations:  None UC:   Q 2 minutes, mild Dilation: 4.5 Effacement (%): 70 Cervical Position: Posterior Station: -2 Presentation: Vertex Exam by:: Shakeem Stern,CNM  AROM large amount of clear fluid  Labs: Results for orders placed or performed during the hospital encounter of 05/10/18 (from the past 24 hour(s))  CBC     Status: Abnormal   Collection Time: 05/10/18  8:07 AM  Result Value Ref Range   WBC 8.8 4.0 - 10.5 K/uL   RBC 3.60 (L) 3.87 - 5.11 MIL/uL   Hemoglobin 8.6 (L) 12.0 - 15.0 g/dL   HCT 29.527.8 (L) 62.136.0 - 30.846.0 %   MCV 77.2 (L) 78.0 - 100.0 fL   MCH 23.9 (L) 26.0 - 34.0 pg   MCHC 30.9 30.0 - 36.0 g/dL   RDW 65.723.4 (H) 84.611.5 - 96.215.5 %   Platelets 282 150 - 400 K/uL  RPR     Status: None   Collection Time: 05/10/18  8:07 AM  Result Value Ref Range   RPR Ser Ql Non Reactive Non Reactive  Type and screen [facility]     Status: None   Collection Time: 05/10/18  8:07 AM  Result Value Ref Range   ABO/RH(D) A POS    Antibody Screen POS    Sample Expiration 05/13/2018    Antibody Identification ANTI E ANTI c    PT AG Type      NEGATIVE FOR c ANTIGEN NEGATIVE FOR E ANTIGEN Performed at [facility], [address] [place], [place] [address]   Prepare RBC     Status: None   Collection Time: 05/10/18 10:32 AM  Result Value Ref Range   Order Confirmation      ORDER PROCESSED BY BLOOD BANK Performed at [facility], [address] [place], [place] [address]     Assessment / Plan: 696w3d week IUP Labor: early Fetal Wellbeing:  Category I Pain Control:   Comfort measures Anticipated MOD:  SVD  Dorathy KinsmanSmith, Pinkey Mcjunkin, CNM 05/10/2018 4:11 PM

## 2018-05-11 LAB — CBC
HCT: 24 % — ABNORMAL LOW (ref 36.0–46.0)
HEMOGLOBIN: 7.7 g/dL — AB (ref 12.0–15.0)
MCH: 24.7 pg — ABNORMAL LOW (ref 26.0–34.0)
MCHC: 32.1 g/dL (ref 30.0–36.0)
MCV: 76.9 fL — ABNORMAL LOW (ref 78.0–100.0)
Platelets: 235 10*3/uL (ref 150–400)
RBC: 3.12 MIL/uL — AB (ref 3.87–5.11)
RDW: 23.3 % — ABNORMAL HIGH (ref 11.5–15.5)
WBC: 10.1 10*3/uL (ref 4.0–10.5)

## 2018-05-11 MED ORDER — FERROUS FUMARATE 324 (106 FE) MG PO TABS
1.0000 | ORAL_TABLET | Freq: Two times a day (BID) | ORAL | Status: DC
  Administered 2018-05-11 – 2018-05-12 (×3): 106 mg via ORAL
  Filled 2018-05-11 (×5): qty 1

## 2018-05-11 MED ORDER — POLYETHYLENE GLYCOL 3350 17 G PO PACK
17.0000 g | PACK | Freq: Every day | ORAL | Status: DC
  Administered 2018-05-11 – 2018-05-12 (×2): 17 g via ORAL
  Filled 2018-05-11 (×3): qty 1

## 2018-05-11 NOTE — Progress Notes (Addendum)
Post Partum Day 1 Subjective: no complaints, up ad lib, voiding, tolerating PO and + flatus she does not have dizziness, weakness, sincope. No headache, RUQ pain, visual changes. Afebrile  Objective: Blood pressure 109/68, pulse 72, temperature 98.5 F (36.9 C), temperature source Oral, resp. rate 16, height 5\' 1"  (1.549 m), weight 95.7 kg (211 lb), last menstrual period 08/07/2017, SpO2 100 %, unknown if currently breastfeeding.  Physical Exam:  General: alert, cooperative and no distress Lochia: appropriate Uterine Fundus: firm Incision: none DVT Evaluation: No evidence of DVT seen on physical exam. No cords or calf tenderness. No significant calf/ankle edema.  Recent Labs    05/10/18 0807 05/11/18 0514  HGB 8.6* 7.7*  HCT 27.8* 24.0*    Assessment/Plan: Plan for discharge tomorrow  Post partum Anemia     LOS: 1 day   Sandi Ravelingnson B Alvis 05/11/2018, 7:52 AM   OB FELLOW POSTPARTUM PROGRESS NOTE ATTESTATION  I have seen and examined this patient and agree with above documentation in the resident's note.   Frederik PearJulie P Kailan Carmen, MD OB Fellow 05/11/2018

## 2018-05-11 NOTE — Lactation Note (Signed)
This note was copied from a baby's chart. Lactation Consultation Note: Spanish Interpreter [phone]#[id number] for all teaching and history. Mother reports that she breastfed her first children for one month each, and her last child for 4.5 years.  Mother reports that infant fed well with last feedings. Mother reports that she can hand express colostrum and she hears infant swallowing.  Mother reports that infant gets sleepy at the breast . Advised mother to do frequent skin to skin to rouse infant.   Offered to observed infant feed. Mother declined to wake infant at this time. Advised mother to continue to breastfeed infant 8-12 times in 24 hours and with feeding cues.  Discussed cluster feeding.  Reviewed hand expression and advised mother to continue to hand express before and after feedings.  Mother advised to page for latch assistance with the next feeding.  Spanish Lactation brochure given with information on all services. WIC representatives present to certify patient.   Patient Name: Ruth Stewart Today's Date: 05/11/2018 Reason for consult: Initial assessment   Maternal Data Does the patient have breastfeeding experience prior to this delivery?: Yes  Feeding Feeding Type: Breast Fed Length of feed: 10 min  LATCH Score                   Interventions Interventions: Breast feeding basics reviewed  Lactation Tools Discussed/Used     Consult Status Consult Status: Follow-up Date: 05/12/18 Follow-up type: In-patient    Stevan BornKendrick, Ermagene Saidi [facility] 05/11/2018, 2:18 PM

## 2018-05-11 NOTE — Progress Notes (Signed)
Admission nutrition screen triggered for unintentional weight loss > 10 lbs within the last month. PNR indicates no recent weight loss . Patients chart reviewed and assessed  for nutritional risk. Patient is determined to be at low nutrition  risk.

## 2018-05-12 LAB — TYPE AND SCREEN
ABO/RH(D): A POS
Antibody Screen: POSITIVE
Donor AG Type: NEGATIVE
Donor AG Type: NEGATIVE
PT AG TYPE: NEGATIVE
UNIT DIVISION: 0
UNIT DIVISION: 0

## 2018-05-12 LAB — BPAM RBC
Blood Product Expiration Date: 201908142359
Blood Product Expiration Date: 201908182359
Unit Type and Rh: 6200
Unit Type and Rh: 6200

## 2018-05-12 MED ORDER — IBUPROFEN 600 MG PO TABS: 600.0000 mg | ORAL_TABLET | Freq: Four times a day (QID) | ORAL | 0 refills | Status: DC

## 2018-05-12 NOTE — Lactation Note (Signed)
This note was copied from a baby's chart. Lactation Consultation Note:  Mothers 37 yr old daughter at the bedside. Mother prefers that she interpret teaching and discharge instructions.  Mother reports that infant is feeding well. She denies having any discomfort with feeding. She reports that breast are filling. Mother was advised to use skin to skin to rouse infant for good feedings.  Mother advised to continue to cue base feed infant and feed at least 8-12 times in 24 hours. Discussed cluster feeding.  Mother reports that she has a hand pump at home if needed.  Informed mother that [facility] that are available when needed. Discussed BFSGS, out patient dept and phone services.  Mother receptive to all teaching.   Patient Name: Ruth Stewart [id number]'s Date: 05/12/2018 Reason for consult: Follow-up assessment   Maternal Data    Feeding Feeding Type: Breast Fed Length of feed: 40 min  LATCH Score                   Interventions    Lactation Tools Discussed/Used     Consult Status Consult Status: Complete    Michel BickersKendrick, Longino Trefz McCoy 05/12/2018, 10:05 AM

## 2018-05-12 NOTE — Discharge Summary (Addendum)
OB Discharge Summary     Patient Name: Ruth Stewart DOB: 20-Jan-1981 MRN: [id number]  Date of admission: 05/10/2018 Delivering MD: Arvilla Market   Date of discharge: 05/12/2018  Admitting diagnosis: INDUCTION Intrauterine pregnancy: [redacted]w[redacted]d     Secondary diagnosis:  Active Problems:   Indication for care in labor and delivery, antepartum  Additional problems: Positive anti-E and anti-C.      Discharge diagnosis: Term Pregnancy Delivered                                                                                                Post partum procedures:none  Augmentation: AROM and Pitocin  Complications: None  Hospital course:  Induction of Labor With Vaginal Delivery   37 y.o. yo [id number] at [redacted]w[redacted]d was admitted to the hospital 05/10/2018 for induction of labor.  Indication for induction: Anti-c and Anti-E antibodies.  Patient had an uncomplicated labor course as follows: Membrane Rupture Time/Date: 3:05 PM ,05/10/2018   Intrapartum Procedures: Episiotomy: None [1]                                         Lacerations:     Patient had delivery of a Viable infant.  Information for the patient's newborn:  Talonda, Artist [[id number]]  Delivery Method: Vag-Spont   05/10/2018  Details of delivery can be found in separate delivery note.  Patient had a routine postpartum course. Patient is discharged home 05/12/18.  Physical exam  Vitals:   05/11/18 0531 05/11/18 1343 05/11/18 2125 05/12/18 0701  BP: 109/68 112/78 122/88 104/65  Pulse: 72 79 84 71  Resp: 16 18 18 18   Temp: 98.5 F (36.9 C)  98.2 F (36.8 C) 97.8 F (36.6 C)  TempSrc: Oral  Oral Oral  SpO2:  98% 99%   Weight:      Height:       General: alert, cooperative and no distress Lochia: appropriate Uterine Fundus: firm Incision: N/A DVT Evaluation: No evidence of DVT seen on physical exam. Labs: Lab Results  Component Value Date   WBC 10.1 05/11/2018   HGB 7.7 (L) 05/11/2018   HCT 24.0 (L)  05/11/2018   MCV 76.9 (L) 05/11/2018   PLT 235 05/11/2018   No flowsheet data found.  Discharge instruction: per After Visit Summary and "Baby and Me Booklet".  After visit meds:  Allergies as of 05/12/2018   No Known Allergies     Medication List    TAKE these medications   ferrous sulfate 325 (65 FE) MG tablet Commonly known as:  FERROUSUL Take 1 tablet (325 mg total) by mouth 2 (two) times daily.   ibuprofen 600 MG tablet Commonly known as:  ADVIL,MOTRIN Take 1 tablet (600 mg total) by mouth every 6 (six) hours.   Prenatal Vitamins 0.8 MG tablet Take 1 tablet by mouth daily.       Diet: routine diet  Activity: Advance as tolerated. Pelvic rest for 6 weeks.   Outpatient follow up:4 weeks Follow  up Appt: Future Appointments  Date Time Provider Department Center  06/14/2018  9:15 AM Judeth HornLawrence, Erin, NP [facility]   Follow up Visit:No follow-ups on file.  Postpartum contraception: Nexplanon  Newborn Data: Live born female  Birth Weight: 8 lb 15.6 oz (4070 g) APGAR: 9, 10  Newborn Delivery   Birth date/time:  05/10/2018 17:31:00 Delivery type:  Vaginal, Spontaneous     Baby Feeding: Breast Disposition:home with mother   05/12/2018 Sandre Kittyaniel K Olson, MD  OB FELLOW DISCHARGE ATTESTATION  I have seen and examined this patient and agree with above documentation in the resident's note.   Frederik PearJulie P Laryssa Hassing, MD OB Fellow 05/12/2018

## 2018-05-12 NOTE — Discharge Instructions (Signed)
Parto vaginal, cuidados de puerperio °(Postpartum Care After Vaginal Delivery) °El período de tiempo que sigue inmediatamente al parto se conoce como puerperio. °¿QUÉ TIPO DE ATENCIÓN MÉDICA RECIBIRÉ? °· Podría continuar recibiendo medicamentos y líquidos través de una vía intravenosa (IV) que se colocará en una de sus venas. °· Si se le realizó una incisión cerca de la vagina (episiotomía) o si ha tenido algún desgarro durante el parto, podrían indicarle que se coloque compresas frías sobre la episiotomía o el desgarro. Esto ayuda a aliviar el dolor y la hinchazón. °· Es posible que le den una botella rociadora para que use cuando vaya al baño. Puede utilizarla hasta que se sienta cómoda limpiándose de la manera habitual. Siga los pasos a continuación para usar la botella rociadora: °? Antes de orinar, llene la botella rociadora con agua tibia. No use agua caliente. °? Después de orinar, mientras aún está sentada en el inodoro, use la botella rociadora para enjuagar el área alrededor de la uretra y la abertura vaginal. Con esto podrá limpiar cualquier rastro de orina y sangre. °? Puede hacer esto en lugar de secarse. Cuando comience a sanar, podrá usar la botella rociadora antes de secarse. Asegúrese de secarse suavemente. °? Llene la botella rociadora con agua limpia cada vez que vaya al baño. °· Deberá usar apósitos sanitarios. °¿CÓMO PUEDO SENTIRME? °· Quizás no tenga necesidad de orinar durante varias horas después del parto. °· Sentirá algo de dolor y molestias en el abdomen y la vagina. °· Si está amamantando, podría tener contracciones uterinas cada vez que lo haga. Estas podrían prolongarse hasta varias semanas durante el puerperio. Las contracciones uterinas ayudan al útero a regresar a su tamaño habitual. °· Es normal tener un poco de hemorragia vaginal (loquios) después del parto. La cantidad y apariencia de los loquios a menudo es similar a las del período menstrual la primera semana después del parto.  Disminuirá gradualmente las siguientes semanas hasta convertirse en una descarga seca amarronada o amarillenta. En la mayoría de las mujeres, los loquios se detienen completamente entre 6 a 8 semanas después del parto. Los sangrados vaginales pueden variar de mujer a mujer. °· Los primeros días después del parto, podría padecer congestión mamaria. Los pechos se sentirán pesados, llenos y molestos. Las mamas también podrían latir y ponerse duras, muy tirantes, calientes y sensibles al tacto. Cuando esto ocurra, podría notar leche que se escapa de los senos. El médico puede recomendarle algunos métodos para aliviar este malestar causado por la congestión mamaria. La congestión mamaria debería desaparecer al cabo de unos días. °· Podría sentirse más deprimida o preocupada que lo habitual debido a los cambios hormonales luego del parto. Estos sentimientos no deben durar más de unos pocos días. Si no desaparecen al cabo de algunos días, hable con su médico. °¿QUÉ CUIDADOS DEBO TENER? °· Infórmele a su médico si siente dolor o malestar. °· Beba suficiente agua para mantener la orina clara o de color amarillo pálido. °· Lávese bien las manos con agua y jabón durante al menos 20 segundos después de cambiar el apósito sanitario, usar el baño o antes de sostener o alimentar al bebé. °· Si no está amamantando, evite tocarse mucho los senos. Al hacerlo, podrían producir más leche. °· Si se siente débil o mareada, o si siente que está a punto de desmayarse, pida ayuda antes de realizar lo siguiente: °? Levantarse de la cama. °? Ducharse. °· Cambie los apósitos sanitarios con frecuencia. Observe si hay cambios en el flujo, como un aumento repentino en el   volumen, cambios en el color o coágulos sanguíneos de gran tamaño. Si expulsa un coágulo sanguíneo por la vagina, guárdelo para mostrárselo a su médico. No tire la cadena sin que el médico examine el coágulo antes. °· Asegúrese de tener todas las vacunas al día. Esto la ayudará a  estar protegida y a proteger al bebé de determinadas enfermedades. Podría necesitar vacunas antes de dejar el hospital. °· Si lo desea, hable con el médico acerca de los métodos de planificación familiar o control de la natalidad (métodos anticonceptivos). °¿CÓMO PUEDO ESTABLECER LAZOS CON MI BEBÉ? °Pasar tanto tiempo como le sea posible con el bebé es sumamente importante. Durante ese tiempo, usted y su bebé pueden conocerse y desarrollar lazos. Tener al bebé con usted en la habitación le dará tiempo de conocerlo. Esto también puede hacerla sentir más cómoda para atender al bebé. Amamantar también puede ayudarla a crear lazos con el bebé. °¿CÓMO PUEDO PLANIFICAR MI REGRESO A CASA CON EL BEBÉ? °· Asegúrese de tener instalada una butaca en el automóvil. °? La butaca debe contar con la certificación del fabricante para asegurarse de que esté instalada en forma segura. °? Asegúrese de que el bebé quede bien asegurado en la butaca. °· Pregúntele al médico todo lo que necesite saber sobre los cuidados de su bebé. Asegúrese de poder comunicarse con el médico en caso de que tenga preguntas luego de dejar el hospital. °Esta información no tiene como fin reemplazar el consejo del médico. Asegúrese de hacerle al médico cualquier pregunta que tenga. °Document Released: 07/31/2007 Document Revised: 01/25/2016 Document Reviewed: 09/07/2015 °Elsevier Interactive Patient Education © 2018 [employer] ° °

## 2018-06-14 ENCOUNTER — Ambulatory Visit (INDEPENDENT_AMBULATORY_CARE_PROVIDER_SITE_OTHER): Payer: Self-pay | Admitting: Student

## 2018-06-14 ENCOUNTER — Encounter: Payer: Self-pay | Admitting: Student

## 2018-06-14 DIAGNOSIS — O99019 Anemia complicating pregnancy, unspecified trimester: Secondary | ICD-10-CM

## 2018-06-14 DIAGNOSIS — Z789 Other specified health status: Secondary | ICD-10-CM

## 2018-06-14 LAB — CBC
Hematocrit: 31.7 % — ABNORMAL LOW (ref 34.0–46.6)
Hemoglobin: 9.9 g/dL — ABNORMAL LOW (ref 11.1–15.9)
MCH: 24.7 pg — ABNORMAL LOW (ref 26.6–33.0)
MCHC: 31.2 g/dL — ABNORMAL LOW (ref 31.5–35.7)
MCV: 79 fL (ref 79–97)
PLATELETS: 224 10*3/uL (ref 150–450)
RBC: 4.01 x10E6/uL (ref 3.77–5.28)
RDW: 19.8 % — ABNORMAL HIGH (ref 12.3–15.4)
WBC: 6.5 10*3/uL (ref 3.4–10.8)

## 2018-06-14 NOTE — Patient Instructions (Addendum)
Eleccin del mtodo anticonceptivo Contraception Choices La anticoncepcin, o los mtodos anticonceptivos, hace referencia a los mtodos o dispositivos que evitan el Gratis. Mtodos hormonales Implante anticonceptivo Un implante anticonceptivo consiste en un tubo plstico delgado que contiene una hormona. Se inserta en la parte superior del brazo. Puede Consulting civil engineer por 3 aos. Inyecciones de progestina sola Las inyecciones de progestina sola contienen progestina, una forma sinttica de la hormona progesterona. Un mdico las administra cada 3 meses. Pldoras anticonceptivas Las pldoras anticonceptivas son pastillas que contienen hormonas que evitan el [place]. Deben tomarse una vez al da, preferentemente a la misma Economist. Parches anticonceptivos El parche anticonceptivo contiene hormonas que evitan el [place]. Se coloca en la piel, debe cambiarse una vez a la semana durante tres semanas y debe retirarse en la cuarta semana. Se necesita una receta para utilizar este mtodo anticonceptivo. Anillo vaginal Un anillo vaginal contiene hormonas que evitan el embarazo. Se coloca en la vagina durante tres semanas y se retira en la cuarta semana. Luego se repite el proceso con un anillo nuevo. Se necesita una receta para utilizar este mtodo anticonceptivo. Anticonceptivo de Associate Professor Los anticonceptivos de emergencia evitan el embarazo despus de Warehouse manager sexo sin proteccin. Vienen en forma de pldora y pueden tomarse hasta 5 das despus de [place]. Funcionan mejor cuando se toman lo ms pronto posible luego de eBay. La mayora de los anticonceptivos de emergencia estn disponibles sin receta mdica. Este mtodo no debe utilizarse como el nico mtodo anticonceptivo. Mtodos de barrera Preservativo masculino Un preservativo masculino es una vaina delgada que se coloca sobre el pene durante el sexo. Los preservativos evitan que el esperma ingrese en el cuerpo de la  [place]. Pueden utilizarse con un espermicida para aumentar la efectividad. Deben desecharse luego de su uso. Preservativo femenino Un preservativo femenino es una vaina blanda y holgada que se coloca en la vagina antes de [place]. El preservativo evita que el esperma ingrese en el cuerpo de la [place]. Deben desecharse luego de su uso. Diafragma Un diafragma es una barrera blanda con forma de cpula. Se inserta en la vagina antes del sexo, junto con un espermicida. El diafragma bloquea el ingreso de esperma en el tero, y el espermicida mata a los espermatozoides. El Designer, fashion/clothing en la vagina durante 6 a 8 horas despus de Warehouse manager sexo y debe retirarse en el plazo de las 24 horas. Un diafragma es recetado y colocado por un mdico. Debe reemplazarse cada 1 a 2 aos, despus de dar a luz, de aumentar ms de 15lb (6.8kg) y de [place] plvica. Capuchn cervical Un capuchn cervical es una copa redonda y blanda de ltex o plstico que se coloca en el cuello uterino. Se inserta en la vagina antes del sexo, junto con un espermicida. Bloquea el ingreso del esperma en el tero. El capuchn Radio producer durante 6 a 8 horas despus de Warehouse manager sexo y debe retirarse en el plazo de las 48 horas. Un capuchn cervical debe ser recetado y colocado por un mdico. Debe reemplazarse cada 2aos. Esponja Una esponja es una pieza blanda y circular de espuma de poliuretano que contiene espermicida. La esponja ayuda a bloquear el ingreso de esperma en el tero, y el espermicida mata a los espermatozoides. Belva Bertin, debe humedecerla e insertarla en la vagina. Debe insertarse antes de eBay, debe permanecer dentro al menos durante 6 horas despus de [place] sexo y debe retirarse y Nurse, adult en  el plazo de las 30 horas. Espermicidas Los espermicidas son sustancias qumicas que matan o bloquean al esperma y no lo dejan ingresar al cuello uterino y al tero. Vienen en forma de crema, gel,  supositorio, espuma o comprimido. Un espermicida debe insertarse en la vagina con un aplicador al menos 10 o 15 minutos antes de tener sexo para dar tiempo a que surta [place]. El proceso debe repetirse cada vez que tenga sexo. Los espermicidas no requieren Emergency planning/management officer. Anticonceptivos intrauterinos Dispositivo intrauterino (DIU). Un DIU un dispositivo en forma de T que se coloca en el tero. Hay dos tipos:  DIU hormonal. Este tipo contiene progestina, una forma sinttica de la hormona progesterona. Este tipo puede permanecer colocado durante 3 a 5 aos.  DIU de cobre. Este tipo est recubierto con un alambre de cobre. Puede permanecer colocado durante 10 aos.  Mtodos anticonceptivos permanentes Ligadura de trompas en la mujer En este mtodo, se sellan, atan u obstruyen las trompas de Falopio durante una ciruga para Automotive engineer que el vulo descienda [place]. Esterilizacin histeroscpica En este mtodo, se coloca un implante pequeo y flexible dentro de cada trompa de Falopio. Los implantes hacen que se forme un tejido cicatricial en las trompas de Falopio y que las obstruya para que el espermatozoide no pueda llegar al vulo. El procedimiento demora alrededor de 3 meses para que sea [place]. Debe utilizarse otro mtodo anticonceptivo durante esos 3 meses. Esterilizacin masculina Este es un procedimiento que consiste en atar los conductos que transportan el esperma (vasectoma). Luego del procedimiento, el hombre Manufacturing engineer lquido (semen). Mtodos de planificacin natural Planificacin familiar natural En este mtodo, la pareja no tiene [employer] la mujer podra quedar [place]. Mtodo calendario Marketing executive un seguimiento de la duracin de cada ciclo menstrual, identificar los [employer] que se puede producir un Psychiatrist y no Warehouse manager sexo durante esos [place]. Mtodo de la ovulacin En este mtodo, la pareja evita tener sexo durante la  ovulacin. Mtodo sintotrmico Este mtodo implica no tener sexo durante la ovulacin. Normalmente, la mujer comprueba la ovulacin al observar cambios en su temperatura y en la consistencia del moco cervical. Mtodo posovulacin En este mtodo, la pareja espera a que finalice la ovulacin para Doctor, hospital. Resumen  La anticoncepcin, o los mtodos anticonceptivos, hace referencia a los mtodos o dispositivos que evitan el [place].  Los mtodos anticonceptivos hormonales incluyen implantes, inyecciones, pastillas, parches, anillos vaginales y anticonceptivos de Associate Professor.  Los mtodos anticonceptivos de barrera pueden incluir preservativos masculinos, preservativos femeninos, diafragmas, capuchones cervicales, esponjas y espermicidas.  Hay dos tipos de DIU (dispositivos intrauterinos). Un DIU puede colocarse en el tero de una mujer para evitar el embarazo durante 3 a 5 aos.  La esterilizacin permanente puede realizarse mediante un procedimiento para hombres, mujeres o ambos.  Los [employer] de Medical sales representative natural incluyen no tener [employer] la mujer podra quedar [place]. Esta informacin no tiene Theme park manager el consejo del mdico. Asegrese de hacerle al mdico cualquier pregunta que tenga. Document Released: 10/03/2005 Document Revised: 01/23/2017 Document Reviewed: 01/23/2017 Elsevier Interactive Patient Education  2018 [employer].     Recomendaciones dietticas para prevenir la formacin de clculos renales Dietary Guidelines to Help Prevent Kidney Stones Los clculos renales son depsitos de minerales y Airline pilot que se forman en el interior de los riones. Su riesgo de Environmental consultant puede ser mayor en funcin de su dieta, el estilo de vida, los  medicamentos que toma y la presencia de ciertas afecciones mdicas. Las instrucciones que se detallan a continuacin disminuyen las posibilidades de tener clculos renales para la mayora de  las personas. Segn su salud general y el tipo de clculos renales que tienda a Environmental education officer, su nutricionista puede darle indicaciones ms especficas. Consejos para seguir Surveyor, minerals [place] de las etiquetas de los alimentos  Elija alimentos con etiquetas que digan "sin sal agregada" o "bajo contenido de sal". Limite el consumo de sodio a menos de 1500mg  por Futures trader.  Elija alimentos con calcio para incluirlos en cada comida o refrigerio. Trate de incorporar 300mg  de calcio en cada comida. Entre los [employer] contienen de 200 a 500mg  de calcio por porcin, se pueden incluir los siguientes: ? 8 onzas ( ) de [place], [place] no lctea fortificada y [place] de fruta fortificado. ? 8 onzas ( ) de Charity fundraiser, yogur y yogur de soja. ? 4 onzas ( ) de tofu. ? 1 onza de queso. ? 1 taza (300g) de higos secos. ? 1 taza (91g) de brcoli cocido. ? 1 lata de 1 a 3 onzas de sardinas o caballa.  La mayora de las personas necesitan de 1000 a 1500mg  de Forensic psychologist. Hable con su nutricionista sobre la cantidad de calcio que recomendara para usted. De compras  Compre mucha fruta y verdura fresca. La [employer] de las personas no Hotel manager las frutas y verduras, aun cuando contengan nutrientes que puedan contribuir a formar clculos renales.  Cuando compre alimentos semipreparados, elija los siguientes: ? Frutas enteras. ? Ensaladas preparadas con aderezo aparte. ? Batidos de fruta y Academic librarian.  Evite comprar comidas congeladas o fiambres.  Busque alimentos con cultivos vivos, como yogur y [place]. Coccin  No agregue sal a los alimentos cuando cocine. Ponga un salero en la mesa y deje que cada persona agregue sal a su gusto.  Para las pastas, guisos y sopas, use protenas vegetales, como frijoles, protena vegetal texturada (PVT) o tofu en lugar de carnes. Planificacin de las comidas  Si su nutricionista se lo indica, ingiera menos sal. Haga lo siguiente: ? Evite consumir  alimentos prehechos o procesados. ? Evite las comidas rpidas.  Coma menos protenas animales, como queso, carne de [place], ave o pescado, si as se lo indica su nutricionista. Haga lo siguiente: ? Limite la cantidad de veces que ingiere carne de vaca, ave, pescado, o queso por semana. Siga una dieta sin carne vacuna, al menos 2 [employer] and [employer]. ? Coma solo una porcin por da de carne de vaca, ave, pescado o mariscos. ? Cuando prepare protenas animales, corte los trozos en porciones pequeas. En trminos generales, una porcin de carne vacuna o de aves tiene casi el tamao de un mazo de cartas.  Coma al menos 5 porciones de frutas frescas y verduras Air cabin crew. Haga lo siguiente: ? Tenga a mano frutas y verduras para los refrigerios. ? Coma una fruta o un puado de frutos rojos con el desayuno. ? Coma una ensalada y fruta en el almuerzo. ? Incorpore dos clases de verduras en la cena.  Limite los alimentos con alto contenido de una sustancia llamada oxalato. Estos incluyen los siguientes: ? [place]. ? Ruibarbo. ? Remolachas. ? Papas fritas y papas fritas de bolsa. ? Frutos secos.  Si toma diurticos regularmente, asegrese de comer al menos 1 o 2 frutas o verduras con alto contenido de [employer]. Estos incluyen los siguientes: ? Aguacate. ? Banana. ? Naranja, ciruela pasa, zanahoria, o jugo de tomate. ?  Patata al horno. ? Repollo. ? Frijoles y arvejas partidas. Instrucciones generales  Beba suficiente lquido para mantener la orina clara o de color amarillo plido. Esto es lo ms importante que Scientist, product/process developmentpuede hacer.  Hable con su mdico y su nutricionista sobre tomar suplementos diarios. En funcin de su salud y de la causa de sus clculos renales, usted puede recibir las siguientes recomendaciones: ? No tomar suplementos con vitaminaC. ? Tomar un suplemento de calcio. ? Tomar un suplemento probitico diario. ? Tomar otros suplementos como magnesio, aceite de pescado o vitamina  B6.  Tome Ingram Micro Inctodos los antibiticos y suplementos como se lo haya indicado el mdico.  Limite el consumo de alcohol a no ms de 1medida por da si es mujer y no est Middleportembarazada, y 2medidas por da si es hombre. Una medida equivale a 12oz (355ml) de cerveza, 5oz (148ml) de vino o 1oz (44ml) de bebidas alcohlicas de alta graduacin.  Si su mdico se lo indica, baje de peso. Trabaje con su nutricionista para encontrar las estrategias y el plan de alimentacin que mejor funcione para usted. Qu alimentos no se recomiendan? Limite la ingesta de los siguientes alimentos, o segn se lo indique su nutricionista. Hable con su nutricionista sobre los alimentos especficos que debera evitar de acuerdo con el tipo de clculos renales que tiene y su estado de salud general. Cereales Panes. Rosquillas. Bollos. Panificados. Galletas saladas. Cereales. Pastas. Verduras Espin[place]. Ruibarbo. Remolachas. Verduras enlatadas. Pepinillos. Aceitunas. Carnes y otros alimentos proteicos Frutos secos. Mantequilla de frutos secos. Porciones grandes de carne vacuna, de ave o de pescado. Embutidos y carnes saladas. Fiambres. Perros calientes. Salchichas. Lcteos Queso. Refrescos Refrescos regulares. Jugo de verduras regular. Condimentos y otros alimentos Mezclas de condimentos con sal. Condimentos para ensalada. Sopas enlatadas. Salsa de soja. Ktchup. Salsa barbacoa. Salsa en lata para pastas. Guisos. Pizza. Lasaa. Comidas congeladas. Papas fritas en bolsa. Papas fritas. Resumen  Puede reducir el riesgo de tener clculos renales si introduce cambios en su dieta.  Lo ms importante es que beba mucho lquido. Debe beber suficiente cantidad de lquido para mantener la orina clara o de color amarillo plido.  Pregntele a su mdico o nutricionista qu cantidad de protenas de origen animal debera consumir cada da, y tambin qu cantidad de sal y calcio debe ingerir. Esta informacin no tiene Microbiologistcomo fin  reemplazar el consejo del mdico. Asegrese de hacerle al mdico cualquier pregunta que tenga. Document Released: 06/27/2012 Document Revised: 01/23/2017 Document Reviewed: 01/23/2017 Elsevier Interactive Patient Education  [employer]2018 Elsevier Inc.

## 2018-06-14 NOTE — Progress Notes (Signed)
Subjective:     Bud FaceSandra Pena Ruiz is a 37 y.o. female who presents for a postpartum visit. She is 5 weeks postpartum following a spontaneous vaginal delivery. I have fully reviewed the prenatal and intrapartum course. The delivery was at 39.3 gestational weeks. Outcome: spontaneous vaginal delivery. Anesthesia: none. Postpartum course has been unremarkable. Baby's course has been unremarkable. Baby is feeding by breast. Bleeding no bleeding. Bowel function is normal. Bladder function is normal. Patient is not sexually active. Contraception method is none. Postpartum depression screening: negative.  The following portions of the patient's history were reviewed and updated as appropriate: allergies, current medications, past family history, past medical history, past social history, past surgical history and problem list.  Review of Systems Pertinent items are noted in HPI.   Objective:    BP 110/85   Pulse 67   Wt 190 lb (86.2 kg)   BMI 35.90 kg/m   General:  alert, cooperative and appears stated age  Lungs: clear to auscultation bilaterally  Heart:  regular rate and rhythm, S1, S2 normal, no murmur, click, rub or gallop  Abdomen: soft, non-tender; bowel sounds normal; no masses,  no organomegaly        Assessment:   Normal postpartum exam. Pap smear up to date.    Plan:   1. Encounter for routine postpartum follow-up -doing well -Pt is self pay, will f/u with [facility] for contraception. Instructed patient to use condoms consistently until her f/u  2. Language barrier -Spanish interpreter at bedside  3. Antepartum anemia -Not taking iron supplement consistently & reports hx of anemia prior to pregnancy. Offered checking today vs f/u with [facility] -- pt would like drawn today - CBC  Judeth HornLawrence, Kadar Chance, NP

## 2019-03-18 ENCOUNTER — Other Ambulatory Visit: Payer: Self-pay | Admitting: Urology

## 2019-03-19 NOTE — Patient Instructions (Addendum)
Ruth Stewart  03/19/2019   Your procedure is scheduled on: Monday 03/25/2019   Report to [facility] Main  Entrance              Report to admitting at  1000  AM               YOU NEED TO HAVE A COVID 19 TEST ON Thursday, March 21, 2019 @_______ , THIS TEST MUST BE DONE BEFORE SURGERY, COME TO [facility] ENTRANCE.    Call this number if you have problems the morning of surgery [phone]    Remember: Do not eat food or drink liquids :After Midnight.              BRUSH YOUR TEETH MORNING OF SURGERY AND RINSE YOUR MOUTH OUT, NO CHEWING GUM CANDY OR MINTS.    Take these medicines the morning of surgery with A SIP OF WATER: none              You may not have any metal on your body including hair pins and              piercings  Do not wear jewelry, make-up, lotions, powders or perfumes, deodorant             Do not wear nail polish.  Do not shave  48 hours prior to surgery.              Do not bring valuables to the hospital. [facility] IS NOT             RESPONSIBLE   FOR VALUABLES.  Contacts, dentures or bridgework may not be worn into surgery.  Leave suitcase in the car. After surgery it may be brought to your room.                  Please read over the following fact sheets you were given: _____________________________________________________________________             [facility] - Preparing for Surgery Before surgery, you can play an important role.  Because skin is not sterile, your skin needs to be as free of germs as possible.  You can reduce the number of germs on your skin by washing with CHG (chlorahexidine gluconate) soap before surgery.  CHG is an antiseptic cleaner which kills germs and bonds with the skin to continue killing germs even after washing. Please DO NOT use if you have an allergy to CHG or antibacterial soaps.  If your skin becomes reddened/irritated stop using the CHG and inform your nurse when you  arrive at Short Stay. Do not shave (including legs and underarms) for at least 48 hours prior to the first CHG shower.  You may shave your face/neck. Please follow these instructions carefully:  1.  Shower with CHG Soap the night before surgery and the  morning of Surgery.  2.  If you choose to wash your hair, wash your hair first as usual with your  normal  shampoo.  3.  After you shampoo, rinse your hair and body thoroughly to remove the  shampoo.                            4.  Use CHG as you would any other liquid soap.  You can apply chg directly  to the  skin and wash                       Gently with a scrungie or clean washcloth.  5.  Apply the CHG Soap to your body ONLY FROM THE NECK DOWN.   Do not use on face/ open                           Wound or open sores. Avoid contact with eyes, ears mouth and genitals (private parts).                       Wash face,  Genitals (private parts) with your normal soap.             6.  Wash thoroughly, paying special attention to the area where your surgery  will be performed.  7.  Thoroughly rinse your body with warm water from the neck down.  8.  DO NOT shower/wash with your normal soap after using and rinsing off  the CHG Soap.                9.  Pat yourself dry with a clean towel.            10.  Wear clean pajamas.            11.  Place clean sheets on your bed the night of your first shower and do not  sleep with pets. Day of Surgery : Do not apply any lotions/deodorants the morning of surgery.  Please wear clean clothes to the hospital/surgery center.  FAILURE TO FOLLOW THESE INSTRUCTIONS MAY RESULT IN THE CANCELLATION OF YOUR SURGERY PATIENT SIGNATURE_________________________________  NURSE SIGNATURE__________________________________  ________________________________________________________________________

## 2019-03-20 ENCOUNTER — Encounter (HOSPITAL_COMMUNITY)
Admission: RE | Admit: 2019-03-20 | Discharge: 2019-03-20 | Disposition: A | Discharge status: Home / Self Care | Payer: Self-pay | Source: Ambulatory Visit | Attending: Urology | Admitting: Urology

## 2019-03-20 ENCOUNTER — Other Ambulatory Visit: Payer: Self-pay

## 2019-03-20 ENCOUNTER — Encounter (HOSPITAL_COMMUNITY): Payer: Self-pay

## 2019-03-20 DIAGNOSIS — Z01812 Encounter for preprocedural laboratory examination: Secondary | ICD-10-CM | POA: Insufficient documentation

## 2019-03-20 DIAGNOSIS — N200 Calculus of kidney: Secondary | ICD-10-CM | POA: Insufficient documentation

## 2019-03-20 HISTORY — DX: Iron deficiency anemia, unspecified: D50.9

## 2019-03-20 HISTORY — DX: Personal history of urinary calculi: Z87.442

## 2019-03-20 LAB — CBC
HCT: 35.9 % — ABNORMAL LOW (ref 36.0–46.0)
Hemoglobin: 10.5 g/dL — ABNORMAL LOW (ref 12.0–15.0)
MCH: 23.7 pg — ABNORMAL LOW (ref 26.0–34.0)
MCHC: 29.2 g/dL — ABNORMAL LOW (ref 30.0–36.0)
MCV: 81 fL (ref 80.0–100.0)
Platelets: 305 10*3/uL (ref 150–400)
RBC: 4.43 MIL/uL (ref 3.87–5.11)
RDW: 17 % — ABNORMAL HIGH (ref 11.5–15.5)
WBC: 6.6 10*3/uL (ref 4.0–10.5)
nRBC: 0 % (ref 0.0–0.2)

## 2019-03-21 ENCOUNTER — Other Ambulatory Visit (HOSPITAL_COMMUNITY)
Admission: RE | Admit: 2019-03-21 | Discharge: 2019-03-21 | Disposition: A | Discharge status: Home / Self Care | Payer: HRSA Program | Source: Ambulatory Visit | Attending: Urology | Admitting: Urology

## 2019-03-21 DIAGNOSIS — Z1159 Encounter for screening for other viral diseases: Secondary | ICD-10-CM | POA: Diagnosis present

## 2019-03-22 LAB — NOVEL CORONAVIRUS, NAA (HOSP ORDER, SEND-OUT TO REF LAB; TAT 18-24 HRS): SARS-CoV-2, NAA: NOT DETECTED

## 2019-03-22 NOTE — Progress Notes (Signed)
Unable to complete COVID19 prescreening call, number busy.

## 2019-03-25 ENCOUNTER — Ambulatory Visit (HOSPITAL_COMMUNITY): Payer: Self-pay

## 2019-03-25 ENCOUNTER — Other Ambulatory Visit: Payer: Self-pay

## 2019-03-25 ENCOUNTER — Ambulatory Visit (HOSPITAL_COMMUNITY)
Admission: RE | Admit: 2019-03-25 | Discharge: 2019-03-27 | Disposition: A | Discharge status: Home / Self Care | Payer: Self-pay | Source: Other Acute Inpatient Hospital | Attending: Urology | Admitting: Urology

## 2019-03-25 ENCOUNTER — Encounter (HOSPITAL_COMMUNITY): Payer: Self-pay | Admitting: Anesthesiology

## 2019-03-25 ENCOUNTER — Encounter (HOSPITAL_COMMUNITY)
Admission: RE | Disposition: A | Discharge status: Home / Self Care | Payer: Self-pay | Source: Other Acute Inpatient Hospital | Attending: Urology

## 2019-03-25 ENCOUNTER — Ambulatory Visit (HOSPITAL_COMMUNITY): Payer: Self-pay | Admitting: Physician Assistant

## 2019-03-25 DIAGNOSIS — N200 Calculus of kidney: Secondary | ICD-10-CM

## 2019-03-25 DIAGNOSIS — Z87442 Personal history of urinary calculi: Secondary | ICD-10-CM | POA: Insufficient documentation

## 2019-03-25 DIAGNOSIS — Z87440 Personal history of urinary (tract) infections: Secondary | ICD-10-CM | POA: Insufficient documentation

## 2019-03-25 DIAGNOSIS — D649 Anemia, unspecified: Secondary | ICD-10-CM | POA: Insufficient documentation

## 2019-03-25 DIAGNOSIS — N132 Hydronephrosis with renal and ureteral calculous obstruction: Secondary | ICD-10-CM | POA: Insufficient documentation

## 2019-03-25 HISTORY — PX: NEPHROLITHOTOMY: SHX5134

## 2019-03-25 HISTORY — PX: CYSTOSCOPY: SHX5120

## 2019-03-25 LAB — HEMOGLOBIN AND HEMATOCRIT, BLOOD
HCT: 32.4 % — ABNORMAL LOW (ref 36.0–46.0)
Hemoglobin: 9.1 g/dL — ABNORMAL LOW (ref 12.0–15.0)

## 2019-03-25 LAB — BASIC METABOLIC PANEL
Anion gap: 11 (ref 5–15)
BUN: 13 mg/dL (ref 6–20)
CO2: 18 mmol/L — ABNORMAL LOW (ref 22–32)
Calcium: 7.7 mg/dL — ABNORMAL LOW (ref 8.9–10.3)
Chloride: 109 mmol/L (ref 98–111)
Creatinine, Ser: 0.87 mg/dL (ref 0.44–1.00)
GFR calc Af Amer: >60 mL/min (ref >60)
GFR calc non Af Amer: >60 mL/min (ref >60)
Glucose, Bld: 196 mg/dL — ABNORMAL HIGH (ref 70–99)
Potassium: 3.6 mmol/L (ref 3.5–5.1)
Sodium: 138 mmol/L (ref 135–145)

## 2019-03-25 LAB — PREGNANCY, URINE: Preg Test, Ur: NEGATIVE

## 2019-03-25 SURGERY — NEPHROLITHOTOMY PERCUTANEOUS
Anesthesia: General | Laterality: Right

## 2019-03-25 MED ORDER — EPHEDRINE SULFATE 50 MG/ML IJ SOLN
INTRAMUSCULAR | Status: DC | PRN
  Administered 2019-03-25 (×2): 5 mg via INTRAVENOUS

## 2019-03-25 MED ORDER — LACTATED RINGERS IV SOLN
INTRAVENOUS | Status: DC
  Administered 2019-03-25 (×3): via INTRAVENOUS

## 2019-03-25 MED ORDER — DEXAMETHASONE SODIUM PHOSPHATE 10 MG/ML IJ SOLN
INTRAMUSCULAR | Status: DC | PRN
  Administered 2019-03-25: 5 mg via INTRAVENOUS

## 2019-03-25 MED ORDER — BACITRACIN-NEOMYCIN-POLYMYXIN 400-5-5000 EX OINT: 1.0000 "application " | TOPICAL_OINTMENT | Freq: Three times a day (TID) | CUTANEOUS | Status: DC | PRN

## 2019-03-25 MED ORDER — ACETAMINOPHEN 10 MG/ML IV SOLN
1000.0000 mg | Freq: Four times a day (QID) | INTRAVENOUS | Status: AC
  Administered 2019-03-26 (×3): 1000 mg via INTRAVENOUS
  Filled 2019-03-25 (×4): qty 100

## 2019-03-25 MED ORDER — DEXAMETHASONE SODIUM PHOSPHATE 10 MG/ML IJ SOLN
INTRAMUSCULAR | Status: AC
  Filled 2019-03-25: qty 1

## 2019-03-25 MED ORDER — BUPIVACAINE-EPINEPHRINE 0.5% -1:200000 IJ SOLN
INTRAMUSCULAR | Status: DC | PRN
  Administered 2019-03-25: 14 mL
  Administered 2019-03-25: 10 mL

## 2019-03-25 MED ORDER — FENTANYL CITRATE (PF) 100 MCG/2ML IJ SOLN: 25.0000 ug | INTRAMUSCULAR | Status: DC | PRN

## 2019-03-25 MED ORDER — ACETAMINOPHEN 500 MG PO TABS
1000.0000 mg | ORAL_TABLET | Freq: Once | ORAL | Status: AC
  Administered 2019-03-25: 1000 mg via ORAL
  Filled 2019-03-25: qty 2

## 2019-03-25 MED ORDER — HYDROMORPHONE HCL 1 MG/ML IJ SOLN
INTRAMUSCULAR | Status: AC
  Administered 2019-03-25: 0.5 mg via INTRAVENOUS
  Filled 2019-03-25: qty 1

## 2019-03-25 MED ORDER — MIDAZOLAM HCL 5 MG/5ML IJ SOLN
INTRAMUSCULAR | Status: DC | PRN
  Administered 2019-03-25 (×2): 1 mg via INTRAVENOUS

## 2019-03-25 MED ORDER — MORPHINE SULFATE (PF) 2 MG/ML IV SOLN
1.0000 mg | INTRAVENOUS | Status: DC | PRN
  Administered 2019-03-25: 2 mg via INTRAVENOUS
  Filled 2019-03-25: qty 1

## 2019-03-25 MED ORDER — EPHEDRINE 5 MG/ML INJ
INTRAVENOUS | Status: AC
  Filled 2019-03-25: qty 10

## 2019-03-25 MED ORDER — LIDOCAINE HCL (CARDIAC) PF 100 MG/5ML IV SOSY
PREFILLED_SYRINGE | INTRAVENOUS | Status: DC | PRN
  Administered 2019-03-25: 40 mg via INTRAVENOUS

## 2019-03-25 MED ORDER — ROCURONIUM BROMIDE 100 MG/10ML IV SOLN
INTRAVENOUS | Status: DC | PRN
  Administered 2019-03-25 (×2): 10 mg via INTRAVENOUS
  Administered 2019-03-25: 60 mg via INTRAVENOUS
  Administered 2019-03-25 (×6): 10 mg via INTRAVENOUS

## 2019-03-25 MED ORDER — FENTANYL CITRATE (PF) 100 MCG/2ML IJ SOLN
INTRAMUSCULAR | Status: AC
  Filled 2019-03-25: qty 2

## 2019-03-25 MED ORDER — HYDROMORPHONE HCL 1 MG/ML IJ SOLN
0.5000 mg | INTRAMUSCULAR | Status: AC | PRN
  Administered 2019-03-25 (×4): 0.5 mg via INTRAVENOUS

## 2019-03-25 MED ORDER — OXYCODONE HCL 5 MG PO TABS
5.0000 mg | ORAL_TABLET | ORAL | Status: DC | PRN
  Administered 2019-03-25 – 2019-03-26 (×2): 10 mg via ORAL
  Filled 2019-03-25 (×2): qty 2

## 2019-03-25 MED ORDER — FENTANYL CITRATE (PF) 250 MCG/5ML IJ SOLN
INTRAMUSCULAR | Status: AC
  Filled 2019-03-25: qty 5

## 2019-03-25 MED ORDER — MORPHINE SULFATE (PF) 2 MG/ML IV SOLN
2.0000 mg | INTRAVENOUS | Status: DC | PRN
  Administered 2019-03-25 – 2019-03-26 (×3): 2 mg via INTRAVENOUS
  Administered 2019-03-26: 4 mg via INTRAVENOUS
  Administered 2019-03-26 (×2): 2 mg via INTRAVENOUS
  Administered 2019-03-26 – 2019-03-27 (×2): 4 mg via INTRAVENOUS
  Filled 2019-03-25: qty 1
  Filled 2019-03-25: qty 2
  Filled 2019-03-25 (×2): qty 1
  Filled 2019-03-25: qty 2
  Filled 2019-03-25: qty 1
  Filled 2019-03-25: qty 2
  Filled 2019-03-25: qty 1
  Filled 2019-03-25: qty 2

## 2019-03-25 MED ORDER — CEFAZOLIN SODIUM-DEXTROSE 2-4 GM/100ML-% IV SOLN
2.0000 g | Freq: Three times a day (TID) | INTRAVENOUS | Status: AC
  Administered 2019-03-25 – 2019-03-26 (×2): 2 g via INTRAVENOUS
  Filled 2019-03-25 (×2): qty 100

## 2019-03-25 MED ORDER — PROPOFOL 10 MG/ML IV BOLUS
INTRAVENOUS | Status: DC | PRN
  Administered 2019-03-25: 150 mg via INTRAVENOUS

## 2019-03-25 MED ORDER — 0.9 % SODIUM CHLORIDE (POUR BTL) OPTIME
TOPICAL | Status: DC | PRN
  Administered 2019-03-25: 1000 mL

## 2019-03-25 MED ORDER — PHENYLEPHRINE HCL (PRESSORS) 10 MG/ML IV SOLN
INTRAVENOUS | Status: DC | PRN
  Administered 2019-03-25: 80 ug via INTRAVENOUS

## 2019-03-25 MED ORDER — ACETAMINOPHEN 500 MG PO TABS
1000.0000 mg | ORAL_TABLET | Freq: Four times a day (QID) | ORAL | Status: DC
  Administered 2019-03-25: 1000 mg via ORAL
  Filled 2019-03-25: qty 2

## 2019-03-25 MED ORDER — IOHEXOL 300 MG/ML  SOLN
INTRAMUSCULAR | Status: DC | PRN
  Administered 2019-03-25: 50 mL

## 2019-03-25 MED ORDER — CEFAZOLIN SODIUM-DEXTROSE 2-4 GM/100ML-% IV SOLN
2.0000 g | INTRAVENOUS | Status: AC
  Administered 2019-03-25 (×2): 2 g via INTRAVENOUS
  Filled 2019-03-25: qty 100

## 2019-03-25 MED ORDER — DOCUSATE SODIUM 100 MG PO CAPS
100.0000 mg | ORAL_CAPSULE | Freq: Two times a day (BID) | ORAL | Status: DC
  Administered 2019-03-26 – 2019-03-27 (×3): 100 mg via ORAL
  Filled 2019-03-25 (×3): qty 1

## 2019-03-25 MED ORDER — PROPOFOL 10 MG/ML IV BOLUS
INTRAVENOUS | Status: AC
  Filled 2019-03-25: qty 20

## 2019-03-25 MED ORDER — BUPIVACAINE-EPINEPHRINE 0.5% -1:200000 IJ SOLN
INTRAMUSCULAR | Status: AC
  Filled 2019-03-25: qty 1

## 2019-03-25 MED ORDER — SUGAMMADEX SODIUM 200 MG/2ML IV SOLN
INTRAVENOUS | Status: AC
  Filled 2019-03-25: qty 2

## 2019-03-25 MED ORDER — PROMETHAZINE HCL 25 MG/ML IJ SOLN
INTRAMUSCULAR | Status: AC
  Administered 2019-03-25: 6.25 mg via INTRAVENOUS
  Filled 2019-03-25: qty 1

## 2019-03-25 MED ORDER — FENTANYL CITRATE (PF) 100 MCG/2ML IJ SOLN
INTRAMUSCULAR | Status: DC | PRN
  Administered 2019-03-25 (×3): 50 ug via INTRAVENOUS
  Administered 2019-03-25: 100 ug via INTRAVENOUS
  Administered 2019-03-25: 25 ug via INTRAVENOUS
  Administered 2019-03-25 (×2): 50 ug via INTRAVENOUS
  Administered 2019-03-25: 25 ug via INTRAVENOUS
  Administered 2019-03-25: 50 ug via INTRAVENOUS
  Administered 2019-03-25: 25 ug via INTRAVENOUS
  Administered 2019-03-25: 50 ug via INTRAVENOUS
  Administered 2019-03-25: 25 ug via INTRAVENOUS
  Administered 2019-03-25 (×2): 50 ug via INTRAVENOUS

## 2019-03-25 MED ORDER — ONDANSETRON HCL 4 MG/2ML IJ SOLN
INTRAMUSCULAR | Status: DC | PRN
  Administered 2019-03-25 (×2): 4 mg via INTRAVENOUS

## 2019-03-25 MED ORDER — SENNA 8.6 MG PO TABS
1.0000 | ORAL_TABLET | Freq: Two times a day (BID) | ORAL | Status: DC
  Administered 2019-03-26 – 2019-03-27 (×3): 8.6 mg via ORAL
  Filled 2019-03-25 (×3): qty 1

## 2019-03-25 MED ORDER — CEFAZOLIN SODIUM-DEXTROSE 2-3 GM-%(50ML) IV SOLR: INTRAVENOUS | Status: DC | PRN

## 2019-03-25 MED ORDER — LIDOCAINE 2% (20 MG/ML) 5 ML SYRINGE
INTRAMUSCULAR | Status: AC
  Filled 2019-03-25: qty 5

## 2019-03-25 MED ORDER — MIDAZOLAM HCL 2 MG/2ML IJ SOLN
INTRAMUSCULAR | Status: AC
  Filled 2019-03-25: qty 2

## 2019-03-25 MED ORDER — SODIUM CHLORIDE 0.9 % IR SOLN
Status: DC | PRN
  Administered 2019-03-25: 45000 mL
  Administered 2019-03-25 (×3): 3000 mL
  Administered 2019-03-25: 15000 mL
  Administered 2019-03-25: 3000 mL
  Administered 2019-03-25: 15000 mL
  Administered 2019-03-25: 3000 mL

## 2019-03-25 MED ORDER — ONDANSETRON HCL 4 MG/2ML IJ SOLN
INTRAMUSCULAR | Status: AC
  Filled 2019-03-25: qty 2

## 2019-03-25 MED ORDER — ROCURONIUM BROMIDE 10 MG/ML (PF) SYRINGE
PREFILLED_SYRINGE | INTRAVENOUS | Status: AC
  Filled 2019-03-25: qty 10

## 2019-03-25 MED ORDER — PROMETHAZINE HCL 25 MG/ML IJ SOLN
6.2500 mg | Freq: Once | INTRAMUSCULAR | Status: AC
  Administered 2019-03-25: 6.25 mg via INTRAVENOUS

## 2019-03-25 MED ORDER — SODIUM CHLORIDE 0.9 % IV SOLN
INTRAVENOUS | Status: DC
  Administered 2019-03-25 – 2019-03-26 (×2): via INTRAVENOUS

## 2019-03-25 MED ORDER — SUGAMMADEX SODIUM 200 MG/2ML IV SOLN
INTRAVENOUS | Status: DC | PRN
  Administered 2019-03-25: 200 mg via INTRAVENOUS

## 2019-03-25 MED ORDER — CEFAZOLIN SODIUM-DEXTROSE 2-4 GM/100ML-% IV SOLN
INTRAVENOUS | Status: AC
  Filled 2019-03-25: qty 100

## 2019-03-25 SURGICAL SUPPLY — 51 items
BAG URINE DRAINAGE (UROLOGICAL SUPPLIES) ×4 IMPLANT
BASKET STONE NCOMPASS (UROLOGICAL SUPPLIES) IMPLANT
BASKET ZERO TIP NITINOL 2.4FR (BASKET) ×4 IMPLANT
BENZOIN TINCTURE PRP APPL 2/3 (GAUZE/BANDAGES/DRESSINGS) ×4 IMPLANT
BLADE SURG 15 STRL LF DISP TIS (BLADE) ×2 IMPLANT
BLADE SURG 15 STRL SS (BLADE) ×2
CATH AINSWORTH 30CC 24FR (CATHETERS) ×4 IMPLANT
CATH FOLEY 2WAY SLVR  5CC 16FR (CATHETERS) ×2
CATH FOLEY 2WAY SLVR 5CC 16FR (CATHETERS) ×2 IMPLANT
CATH IMAGER II 65CM (CATHETERS) ×8 IMPLANT
CATH URET 5FR 28IN OPEN ENDED (CATHETERS) IMPLANT
CATH URET DUAL LUMEN 6-10FR 50 (CATHETERS) IMPLANT
CATH X-FORCE N30 NEPHROSTOMY (TUBING) ×8 IMPLANT
CHLORAPREP W/TINT 26 (MISCELLANEOUS) ×4 IMPLANT
COVER WAND RF STERILE (DRAPES) IMPLANT
DRAPE C-ARM 42X120 X-RAY (DRAPES) ×4 IMPLANT
DRAPE LINGEMAN PERC (DRAPES) ×4 IMPLANT
DRSG PAD ABDOMINAL 8X10 ST (GAUZE/BANDAGES/DRESSINGS) IMPLANT
DRSG TEGADERM 4X4.75 (GAUZE/BANDAGES/DRESSINGS) ×4 IMPLANT
DRSG TEGADERM 8X12 (GAUZE/BANDAGES/DRESSINGS) IMPLANT
EXTRACTOR STONE 1.7FRX115CM (UROLOGICAL SUPPLIES) IMPLANT
FIBER LASER FLEXIVA 365 (UROLOGICAL SUPPLIES) ×4 IMPLANT
FIBER LASER TRAC TIP (UROLOGICAL SUPPLIES) IMPLANT
GAUZE SPONGE 4X4 12PLY STRL (GAUZE/BANDAGES/DRESSINGS) ×4 IMPLANT
GLOVE BIOGEL M STRL SZ7.5 (GLOVE) ×4 IMPLANT
GOWN STRL REUS W/TWL XL LVL3 (GOWN DISPOSABLE) ×4 IMPLANT
GUIDEWIRE AMPLAZ .035X145 (WIRE) ×8 IMPLANT
GUIDEWIRE ANG ZIPWIRE 038X150 (WIRE) ×4 IMPLANT
GUIDEWIRE STR DUAL SENSOR (WIRE) ×4 IMPLANT
HOLDER NEEDLE AMPLATZ W/INSERT (MISCELLANEOUS) ×4 IMPLANT
IV SET EXTENSION CATH 6 NF (IV SETS) ×4 IMPLANT
KIT BASIN OR (CUSTOM PROCEDURE TRAY) ×4 IMPLANT
KIT PROBE TRILOGY 3.9X350 (MISCELLANEOUS) ×8 IMPLANT
KIT TURNOVER KIT A (KITS) IMPLANT
MANIFOLD NEPTUNE II (INSTRUMENTS) ×4 IMPLANT
NEEDLE SPNL 20GX3.5 QUINCKE YW (NEEDLE) ×4 IMPLANT
NEEDLE TROCAR 18X15 ECHO (NEEDLE) ×4 IMPLANT
NEEDLE TROCAR 18X20 (NEEDLE) IMPLANT
NS IRRIG 1000ML POUR BTL (IV SOLUTION) ×4 IMPLANT
PACK CYSTO (CUSTOM PROCEDURE TRAY) ×4 IMPLANT
SHEATH PEELAWAY SET 9 (SHEATH) ×4 IMPLANT
SHEATH X FORCE 10MMX22CM (SHEATH) ×4 IMPLANT
SPONGE LAP 4X18 RFD (DISPOSABLE) ×4 IMPLANT
STENT URET 6FRX24 CONTOUR (STENTS) ×4 IMPLANT
SUT ETHILON 3 0 PS 1 (SUTURE) ×4 IMPLANT
SYR 10ML LL (SYRINGE) ×4 IMPLANT
SYR 20CC LL (SYRINGE) ×8 IMPLANT
SYR 50ML LL SCALE MARK (SYRINGE) ×4 IMPLANT
TOWEL OR 17X26 10 PK STRL BLUE (TOWEL DISPOSABLE) ×4 IMPLANT
TUBING CONNECTING 10 (TUBING) ×6 IMPLANT
TUBING CONNECTING 10' (TUBING) ×2

## 2019-03-25 NOTE — H&P (View-Only) (Signed)
Acute Kidney Stone  HPI: Ruth Stewart is a 38 year-old female patient who is here for further eval and management of kidney stones.  She was diagnosed with a kidney stone on approximately 02/11/2019. The patient presented to Chiropractor with symptoms of a kidney stone.   The pain is on both sides.   Abdomen/Pelvic CT: None. The patient underwent KUB prior to today's appointment.   The patient relates initially having voiding symptoms. She is not currently having flank pain, back pain, groin pain, nausea, vomiting, fever or chills. She has not caught a stone in her urine strainer since her symptoms began.   She has never had surgical treatment for calculi in the past. This is not her first kidney stone. Her first stone was approximately 02/15/2012. She has had 2 stones prior to getting this one.   Patient reports she went to chiropractor about a week ago due to back issues. She has a history of kidney stones and passed 2 kidney stones in 2013 when she was pregnant. No passage of stones since. Reportedly had an x-ray performed at her chiropractor's office. She has an image of this on her phone which looks like a right staghorn kidney.   Interval history 03/07/19: Patient returns today to review CT scan. Demonstrates large right-sided staghorn calculus and small non-obstructing left renal stones. No significant hydronephrosis bilaterally. Right kidney does not appear atrophic. Patient is overall doing well; continues to have mild intermittent flank pain bilaterally. No fevers, no hematuria.  She is unable to state how long her intermittent back pain has been occurring. Very intermittent, dull, achy. Endorses dysuria which she reports has been "always" occurring. Denies urgency or frequency. Reports a history of UTIs, but states that she doesn't go to the MD for diagnosis and treatment unless it's "really bad" - has gone 0-1 times in the past year.   No PCP. No recent labs.  No records, no imaging in  PACS.     ALLERGIES: None   MEDICATIONS: None   GU PSH: None   NON-GU PSH: None   GU PMH: Renal calculus - 02/18/2019    NON-GU PMH: None   FAMILY HISTORY: Asthma - Mother Heart Attack - Brother   SOCIAL HISTORY: Marital Status: Single Preferred Language: Spanish; Castilian; Ethnicity:  Current Smoking Status: Patient has never smoked.   Tobacco Use Assessment Completed: Used Tobacco in last 30 days? Social Drinker.  Drinks 1 caffeinated drink per day.    REVIEW OF SYSTEMS:    GU Review Female:   Patient denies frequent urination, hard to postpone urination, burning /pain with urination, get up at night to urinate, leakage of urine, stream starts and stops, trouble starting your stream, have to strain to urinate, and being pregnant.  Gastrointestinal (Upper):   Patient denies nausea, vomiting, and indigestion/ heartburn.  Gastrointestinal (Lower):   Patient denies diarrhea and constipation.  Constitutional:   Patient denies fever, night sweats, weight loss, and fatigue.  Skin:   Patient denies skin rash/ lesion and itching.  Eyes:   Patient denies blurred vision and double vision.  Ears/ Nose/ Throat:   Patient denies sore throat and sinus problems.  Hematologic/Lymphatic:   Patient denies swollen glands and easy bruising.  Cardiovascular:   Patient denies leg swelling and chest pains.  Respiratory:   Patient denies cough and shortness of breath.  Endocrine:   Patient denies excessive thirst.  Musculoskeletal:   Patient denies joint pain and back pain.  Neurological:   Patient denies   headaches and dizziness.  Psychologic:   Patient denies depression and anxiety.   VITAL SIGNS:      03/07/2019 03:03 PM  BP 127/77 mmHg  Pulse 85 /min  Temperature 98.3 F / 36.8 C   MULTI-SYSTEM PHYSICAL EXAMINATION:    Constitutional: Well-nourished. No physical deformities. Normally developed. Good grooming.  Neck: Neck symmetrical, not swollen. Normal tracheal position.  Respiratory:  No labored breathing, no use of accessory muscles.   Cardiovascular: Normal temperature, normal extremity pulses, no swelling, no varicosities.  Skin: No paleness, no jaundice, no cyanosis. No lesion, no ulcer, no rash.  Neurologic / Psychiatric: Oriented to time, oriented to place, oriented to person. No depression, no anxiety, no agitation.  Gastrointestinal: No mass, no tenderness, no rigidity, non obese abdomen.  Eyes: Normal conjunctivae. Normal eyelids.  Ears, Nose, Mouth, and Throat: Left ear no scars, no lesions, no masses. Right ear no scars, no lesions, no masses. Nose no scars, no lesions, no masses. Normal hearing. Normal lips.  Musculoskeletal: Normal gait and station of head and neck.     PAST DATA REVIEWED:  Source Of History:  Patient  Urine Test Review:   Urinalysis  X-Ray Review: C.T. Abdomen/Pelvis: Reviewed Films. Reviewed Report. Discussed With Patient.     PROCEDURES:          Urinalysis w/Scope Dipstick Dipstick Cont'd Micro  Color: Yellow Bilirubin: Neg mg/dL WBC/hpf: >60/hpf  Appearance: Cloudy Ketones: Neg mg/dL RBC/hpf: 40 - 60/hpf  Specific Gravity: 1.025 Blood: 3+ ery/uL Bacteria: Many (>50/hpf)  pH: 6.0 Protein: 2+ mg/dL Cystals: NS (Not Seen)  Glucose: Neg mg/dL Urobilinogen: 0.2 mg/dL Casts: NS (Not Seen)    Nitrites: Neg Trichomonas: Not Present    Leukocyte Esterase: 3+ leu/uL Mucous: Not Present      Epithelial Cells: 0 - 5/hpf      Yeast: NS (Not Seen)      Sperm: Not Present    ASSESSMENT:      ICD-10 Details  1 GU:   Renal calculus - N20.0    PLAN:            Medications New Meds: Keflex 500 mg capsule 1 capsule PO BID   #14  0 Refill(s)            Document Letter(s):  Created for Patient: Clinical Summary         Notes:   1. Right staghorn calculus: Reviewed CT scan results and discussed management options today. Discussed risk of renal damage given large staghorn stone and discussed possible role of renal lasix scan to confirm  function. However, given lack of significant atrophy on CT and young age of patient, we are unlikely to pursue a nephrectomy instead of stone management even if unequal renal functioning. Therefore, will defer renal lasix scan prior to management of stone. Given large size of stone, do not recommend ureteroscopy or ESWL and instead, recommend right PCNL. Discussed risks and benefits of the procedure, as well as the likelihood that she may require multiple staged procedures or multiple access points to treat the entirety of the stone. Discussed anticipated hospital course and need for nephrostomy tube post-operatively. Patient is nervous about the procedure, but would like to proceed.   Prescribed keflex to be started 1 week prior to procedure given previous mixed urine culture.

## 2019-03-25 NOTE — Brief Op Note (Signed)
03/25/2019  6:10 PM  PATIENT:  Ruth Stewart  38 y.o. female  PRE-OPERATIVE DIAGNOSIS:  NEPHROLITHIASIS  POST-OPERATIVE DIAGNOSIS:  nephrolithiasis  PROCEDURE:  Procedure(s): NEPHROLITHOTOMY PERCUTANEOUS WITH SURGEON ACCESS (Right) CYSTOSCOPY FLEXIBLE (N/A)  SURGEON:  Surgeon(s) and Role:    * Ardis Hughs, MD - Primary   ASSISTANTS: Coralie Common, MD PhD   ANESTHESIA:   general  EBL:  130 mL   BLOOD ADMINISTERED:none  DRAINS: Urinary Catheter (Foley)   LOCAL MEDICATIONS USED:  MARCAINE     SPECIMEN:  Kidney stones  DISPOSITION OF SPECIMEN:  PATHOLOGY  COUNTS:  YES  TOURNIQUET:  * No tourniquets in log *  DICTATION: .Note written in EPIC  PLAN OF CARE: Admit for overnight observation  PATIENT DISPOSITION:  PACU - hemodynamically stable.   Delay start of Pharmacological VTE agent (>24hrs) due to surgical blood loss or risk of bleeding: yes

## 2019-03-25 NOTE — H&P (Signed)
Acute Kidney Stone  HPI: Ruth Stewart is a 38 year-old female patient who is here for further eval and management of kidney stones.  She was diagnosed with a kidney stone on approximately 02/11/2019. The patient presented to Chiropractor with symptoms of a kidney stone.   The pain is on both sides.   Abdomen/Pelvic CT: None. The patient underwent KUB prior to today's appointment.   The patient relates initially having voiding symptoms. She is not currently having flank pain, back pain, groin pain, nausea, vomiting, fever or chills. She has not caught a stone in her urine strainer since her symptoms began.   She has never had surgical treatment for calculi in the past. This is not her first kidney stone. Her first stone was approximately 02/15/2012. She has had 2 stones prior to getting this one.   Patient reports she went to chiropractor about a week ago due to back issues. She has a history of kidney stones and passed 2 kidney stones in 2013 when she was pregnant. No passage of stones since. Reportedly had an x-ray performed at her chiropractor's office. She has an image of this on her phone which looks like a right staghorn kidney.   Interval history 03/07/19: Patient returns today to review CT scan. Demonstrates large right-sided staghorn calculus and small non-obstructing left renal stones. No significant hydronephrosis bilaterally. Right kidney does not appear atrophic. Patient is overall doing well; continues to have mild intermittent flank pain bilaterally. No fevers, no hematuria.  She is unable to state how long her intermittent back pain has been occurring. Very intermittent, dull, achy. Endorses dysuria which she reports has been "always" occurring. Denies urgency or frequency. Reports a history of UTIs, but states that she doesn't go to the MD for diagnosis and treatment unless it's "really bad" - has gone 0-1 times in the past year.   No PCP. No recent labs.  No records, no imaging in  PACS.     ALLERGIES: None   MEDICATIONS: None   GU PSH: None   NON-GU PSH: None   GU PMH: Renal calculus - 02/18/2019    NON-GU PMH: None   FAMILY HISTORY: Asthma - Mother Heart Attack - Brother   SOCIAL HISTORY: Marital Status: Single Preferred Language: Spanish; Castilian; Ethnicity:  Current Smoking Status: Patient has never smoked.   Tobacco Use Assessment Completed: Used Tobacco in last 30 days? Social Drinker.  Drinks 1 caffeinated drink per day.    REVIEW OF SYSTEMS:    GU Review Female:   Patient denies frequent urination, hard to postpone urination, burning /pain with urination, get up at night to urinate, leakage of urine, stream starts and stops, trouble starting your stream, have to strain to urinate, and being pregnant.  Gastrointestinal (Upper):   Patient denies nausea, vomiting, and indigestion/ heartburn.  Gastrointestinal (Lower):   Patient denies diarrhea and constipation.  Constitutional:   Patient denies fever, night sweats, weight loss, and fatigue.  Skin:   Patient denies skin rash/ lesion and itching.  Eyes:   Patient denies blurred vision and double vision.  Ears/ Nose/ Throat:   Patient denies sore throat and sinus problems.  Hematologic/Lymphatic:   Patient denies swollen glands and easy bruising.  Cardiovascular:   Patient denies leg swelling and chest pains.  Respiratory:   Patient denies cough and shortness of breath.  Endocrine:   Patient denies excessive thirst.  Musculoskeletal:   Patient denies joint pain and back pain.  Neurological:   Patient denies  headaches and dizziness.  Psychologic:   Patient denies depression and anxiety.   VITAL SIGNS:      03/07/2019 03:03 PM  BP 127/77 mmHg  Pulse 85 /min  Temperature 98.3 F / 36.8 C   MULTI-SYSTEM PHYSICAL EXAMINATION:    Constitutional: Well-nourished. No physical deformities. Normally developed. Good grooming.  Neck: Neck symmetrical, not swollen. Normal tracheal position.  Respiratory:  No labored breathing, no use of accessory muscles.   Cardiovascular: Normal temperature, normal extremity pulses, no swelling, no varicosities.  Skin: No paleness, no jaundice, no cyanosis. No lesion, no ulcer, no rash.  Neurologic / Psychiatric: Oriented to time, oriented to place, oriented to person. No depression, no anxiety, no agitation.  Gastrointestinal: No mass, no tenderness, no rigidity, non obese abdomen.  Eyes: Normal conjunctivae. Normal eyelids.  Ears, Nose, Mouth, and Throat: Left ear no scars, no lesions, no masses. Right ear no scars, no lesions, no masses. Nose no scars, no lesions, no masses. Normal hearing. Normal lips.  Musculoskeletal: Normal gait and station of head and neck.     PAST DATA REVIEWED:  Source Of History:  Patient  Urine Test Review:   Urinalysis  X-Ray Review: C.T. Abdomen/Pelvis: Reviewed Films. Reviewed Report. Discussed With Patient.     PROCEDURES:          Urinalysis w/Scope Dipstick Dipstick Cont'd Micro  Color: Yellow Bilirubin: Neg mg/dL WBC/hpf: >60/hpf  Appearance: Cloudy Ketones: Neg mg/dL RBC/hpf: 40 - 60/hpf  Specific Gravity: 1.025 Blood: 3+ ery/uL Bacteria: Many (>50/hpf)  pH: 6.0 Protein: 2+ mg/dL Cystals: NS (Not Seen)  Glucose: Neg mg/dL Urobilinogen: 0.2 mg/dL Casts: NS (Not Seen)    Nitrites: Neg Trichomonas: Not Present    Leukocyte Esterase: 3+ leu/uL Mucous: Not Present      Epithelial Cells: 0 - 5/hpf      Yeast: NS (Not Seen)      Sperm: Not Present    ASSESSMENT:      ICD-10 Details  1 GU:   Renal calculus - N20.0    PLAN:            Medications New Meds: Keflex 500 mg capsule 1 capsule PO BID   #14  0 Refill(s)            Document Letter(s):  Created for Patient: Clinical Summary         Notes:   1. Right staghorn calculus: Reviewed CT scan results and discussed management options today. Discussed risk of renal damage given large staghorn stone and discussed possible role of renal lasix scan to confirm  function. However, given lack of significant atrophy on CT and young age of patient, we are unlikely to pursue a nephrectomy instead of stone management even if unequal renal functioning. Therefore, will defer renal lasix scan prior to management of stone. Given large size of stone, do not recommend ureteroscopy or ESWL and instead, recommend right PCNL. Discussed risks and benefits of the procedure, as well as the likelihood that she may require multiple staged procedures or multiple access points to treat the entirety of the stone. Discussed anticipated hospital course and need for nephrostomy tube post-operatively. Patient is nervous about the procedure, but would like to proceed.   Prescribed keflex to be started 1 week prior to procedure given previous mixed urine culture.

## 2019-03-25 NOTE — Interval H&P Note (Signed)
History and Physical Interval Note:  03/25/2019 10:50 AM  Ruth Stewart  has presented today for surgery, with the diagnosis of NEPHROLITHIASIS.  The various methods of treatment have been discussed with the patient and family. After consideration of risks, benefits and other options for treatment, the patient has consented to  Procedure(s): NEPHROLITHOTOMY PERCUTANEOUS WITH SURGEON ACCESS (Right) as a surgical intervention.  The patient's history has been reviewed, patient examined, no change in status, stable for surgery.  I have reviewed the patient's chart and labs.  Questions were answered to the patient's satisfaction.     Ardis Hughs

## 2019-03-25 NOTE — Anesthesia Postprocedure Evaluation (Signed)
Anesthesia Post Note  Patient: Ruth Stewart  Procedure(s) Performed: NEPHROLITHOTOMY PERCUTANEOUS WITH SURGEON ACCESS (Right ) CYSTOSCOPY FLEXIBLE (N/A )     Patient location during evaluation: PACU Anesthesia Type: General Level of consciousness: awake and alert Pain management: pain level controlled Vital Signs Assessment: post-procedure vital signs reviewed and stable Respiratory status: spontaneous breathing, nonlabored ventilation and respiratory function stable Cardiovascular status: blood pressure returned to baseline and stable Postop Assessment: no apparent nausea or vomiting Anesthetic complications: no    Brennan Bailey

## 2019-03-25 NOTE — Anesthesia Preprocedure Evaluation (Addendum)
Anesthesia Evaluation  Patient identified by MRN, date of birth, ID band Patient awake    Reviewed: Allergy & Precautions, NPO status , Patient's Chart, lab work & pertinent test results  Airway Mallampati: II  TM Distance: >3 FB Neck ROM: Full    Dental no notable dental hx. (+) Teeth Intact, Dental Advisory Given   Pulmonary neg pulmonary ROS,    Pulmonary exam normal breath sounds clear to auscultation       Cardiovascular negative cardio ROS Normal cardiovascular exam Rhythm:Regular Rate:Normal     Neuro/Psych negative neurological ROS  negative psych ROS   GI/Hepatic negative GI ROS, Neg liver ROS,   Endo/Other  negative endocrine ROS  Renal/GU negative Renal ROS  negative genitourinary   Musculoskeletal negative musculoskeletal ROS (+)   Abdominal   Peds  Hematology  (+) Blood dyscrasia, anemia ,   Anesthesia Other Findings   Reproductive/Obstetrics negative OB ROS                            Anesthesia Physical Anesthesia Plan  ASA: II  Anesthesia Plan: General   Post-op Pain Management:    Induction: Intravenous  PONV Risk Score and Plan: 3 and Midazolam, Dexamethasone and Ondansetron  Airway Management Planned: Oral ETT  Additional Equipment:   Intra-op Plan:   Post-operative Plan: Extubation in OR  Informed Consent: I have reviewed the patients History and Physical, chart, labs and discussed the procedure including the risks, benefits and alternatives for the proposed anesthesia with the patient or authorized representative who has indicated his/her understanding and acceptance.     Dental advisory given  Plan Discussed with: CRNA  Anesthesia Plan Comments:         Anesthesia Quick Evaluation

## 2019-03-25 NOTE — Transfer of Care (Signed)
Immediate Anesthesia Transfer of Care Note  Patient: Ruth Stewart  Procedure(s) Performed: NEPHROLITHOTOMY PERCUTANEOUS WITH SURGEON ACCESS (Right ) CYSTOSCOPY FLEXIBLE (N/A )  Patient Location: PACU  Anesthesia Type:General  Level of Consciousness: awake, alert , patient cooperative and responds to stimulation  Airway & Oxygen Therapy: Patient Spontanous Breathing and Patient connected to face mask oxygen  Post-op Assessment: Report given to RN and Post -op Vital signs reviewed and stable  Post vital signs: Reviewed and stable  Last Vitals:  Vitals Value Taken Time  BP 126/80 03/25/2019  6:19 PM  Temp 36.8 C 03/25/2019  6:19 PM  Pulse 79 03/25/2019  6:29 PM  Resp 16 03/25/2019  6:29 PM  SpO2 100 % 03/25/2019  6:29 PM  Vitals shown include unvalidated device data.  Last Pain:  Vitals:   03/25/19 1048  TempSrc:   PainSc: 3       Patients Stated Pain Goal: 3 (80/16/55 [id number])  Complications: No apparent anesthesia complications

## 2019-03-25 NOTE — Anesthesia Procedure Notes (Signed)
Procedure Name: Intubation Date/Time: 03/25/2019 11:59 AM Performed by: Garrel Ridgel, CRNA Pre-anesthesia Checklist: Patient identified, Emergency Drugs available, Suction available, Patient being monitored and Timeout performed Patient Re-evaluated:Patient Re-evaluated prior to induction Oxygen Delivery Method: Circle system utilized Preoxygenation: Pre-oxygenation with 100% oxygen Induction Type: IV induction Ventilation: Mask ventilation without difficulty Laryngoscope Size: Mac and 2 Grade View: Grade I Tube size: 7.0 mm Number of attempts: 1 Airway Equipment and Method: Stylet Secured at: 21 cm Tube secured with: Tape Dental Injury: Teeth and Oropharynx as per pre-operative assessment

## 2019-03-25 NOTE — Op Note (Addendum)
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 Right percutaneous nephrostolithotomy for stone burden greater than 2 cm 2. Right percutaneous renal access to establish nephrostomy tract 3. Cystoscopy with Right ureteral catheterization and retrograde pyelogram 4. Simple Foley catheter placement 5. Intraoperative fluoroscopy with interpretation less than 1 hour 6. Right ureteral stent placement, 6x 24 cm without dangler   Attending Surgeon:  Louis Meckel, M.D.   Resident Surgeon:  Lonia Farber Gessner,M.D.   Assistants:  None listed   Anesthesia:  General via endotracheal tube.     IV Fluids:  See Anesthesia record.   Estimated Blood Loss:  130 mL mLs.   Cultures: None   Drains: 16 French Foley catheter  Implants: 6 x 24 cm right ureteral stent, without dangler   Specimens: Right renal stone   Complications:  None.   Indications for Surgery:  Ruth Stewart is a 38 y.o. female who was found on x-ray by her chiropractor to have kidey stones. The patient was evaluated and noted with right staghorn kidney stone. The patient presents today for percutaneous treatment of Right kidney stone. The risks and benefits of the procedure were discussed with the patient who wishes to proceed.   Operative Findings:   Successful percutaneous access was obtained to the posterior upper pole calyx and treatment of the entirety of the accessible stone burden. Given additional stone that was inaccessible from our initial access, a second percutaneous access was obtained to the posterior lower pole calyx with subsequent treatment of the remaining stone burden. There were some very small residual stone fragments at the completion of the case, but the remainder of the stone burden was thought to be treated. Successful placement of right ureteral stent.  Radiologic Interpretation: - Retrograde  pyelogram demonstrated a hydronephrotic upper pole calyx without significant stone burden and a large staghorn calculus filling much of the remaining collecting system.  - Successful placement of right ureteral stent.  - No residual stone visible on fluoroscopy at completion of case.    Procedure:  The patient was correctly identified in the preoperative holding area where written informed consent as well as potential risks and complications were reviewed. The patient was brought back to the operative suite where a preinduction timeout was performed. Once correct information was verified, general anesthesia was induced via endotracheal tube. The patient was then gently repositioned into the prone split leg position, paying careful attention to pad all pressure points and affixed the patient to the bed at multiple points of contact.  She was then prepped and draped in the usual sterile fashion and given appropriate perioperative procedural antibiotics.  Sequential compression devices were placed for VTE prophylaxis.  A second timeout was then performed.   At the beginning of the case, flexible cystoscope was performed per urethra with copious lubrication and normal saline irrigation running.  The urethra and bladder appeared grossly normal.  Turning our attention to the Right ureteral orifice, we gently cannulated the orifice with a sensor wire, which was advanced into the renal pelvis under fluoroscopic guidance.  The cystoscope was removed and a 5 [place] open-ended catheter was advanced over the wire into the renal pelvis confirmed by fluoroscopy.  The wire was removed.  A 16 French Foley catheter was placed per urethra with return of urine with 10 mL's of water in the balloon.   Next, we performed a retrograde pyelogram, which  demonstrated findings as above.  We elected to obtain upper pole access and did so using the Bulls-eye technique and fluoroscopic guidance. After placement of the access needle, a  sensor was advanced into the kidney and manipulated down the patient's ureter with the assistance of the Kumpe catheter. This wire was subsequently switched to a super stiff. Next, using a combination of catheters and dilators, we placed a second safety wire and then developed our percutaneous tract with the advancement of a 30 French x 20 cm balloon dilator.  After this, a 30 French sheath was advanced to the edge of the distal calyx on fluoroscopy.     We then performed rigid nephroscopy with the trilogy lithotrite. Immediately upon entrance into the collecting system, we encountered the stone and we then proceeded to treat the stone with lithotripsy.   Given the large amount of stone burden that remained inaccessible from the initial upper pole access site, we elected to obtain a second access site in the lower pole where a large stone burden remained. Part of this calyx was cleared using flexible nephroscopy and laser lithotripsy with basket extraction. We then obtained a second access site in the lower pole calyx using the same bullseye technique described earlier. We once again dilated the access tract in an identical fashion as described above and placed a 30 Fr access sheath over the balloon dilator. We once again performed We then performed rigid nephroscopy with the trilogy lithotrite. Once we reached the limit of stone treatment with rigid nephroscopy, we switched to flexible nephroscopy with laser lithotripsy and basket extraction to treat the remaining stone burden.   We then switched to flexible ureteroscopy and visualized the entire ureter to the bladder in an antegrade fashion. At the conclusion of the procedure, we repeated flexible nephroscopy in the kidney as well and withdrew our sheath over our rigid nephroscope to the edge of renal parenchyma which did not  reveal any residual stone fragments.   At this point, we elected to place our ureteral stent under direct and fluoroscopic  guidance. Using the nephroscope, we passed the ureteral stent into the bladder and ureter through the nephroscopy and confirmed appropriate stent placement. We removed the access sheath from the lower pole access and confirmed hemostasis. Local anesthetic was injected into the tract and skin was closed using 2 vertical mattress sutures with 3-0 nylon. We then turned our attention to the upper pole access site. We placed   We passed an [employer] over the sensor wire in to the renal pelvis and removed the sheath. Once hemostasis was confirmed, the catheter was removed, leaving only the sensor wire in place. Hemostasis was once again confirmed and the sensor wire was removed. Local anesthetic was injected into the access site tract and skin was closed using 3-0 nlyon interrrupted vertical mattress sutures (2). The site was then dressed in the usual gauze and tegaderm dressing and the patient was carefully returned to supine position. At this point, the patient was extubated and taken to the recovery area in stable fashion.  Post-operative plan: - Admit to urology service for routine post-operative care. - Anticipate Foley catheter removal on POD1. - CT stone protocol on the morning of POD1.

## 2019-03-26 ENCOUNTER — Encounter (HOSPITAL_COMMUNITY): Payer: Self-pay | Admitting: Urology

## 2019-03-26 ENCOUNTER — Ambulatory Visit (HOSPITAL_COMMUNITY): Payer: Self-pay

## 2019-03-26 LAB — BASIC METABOLIC PANEL
Anion gap: 6 (ref 5–15)
BUN: 11 mg/dL (ref 6–20)
CO2: 22 mmol/L (ref 22–32)
Calcium: 7.8 mg/dL — ABNORMAL LOW (ref 8.9–10.3)
Chloride: 108 mmol/L (ref 98–111)
Creatinine, Ser: 0.78 mg/dL (ref 0.44–1.00)
GFR calc Af Amer: >60 mL/min (ref >60)
GFR calc non Af Amer: >60 mL/min (ref >60)
Glucose, Bld: 156 mg/dL — ABNORMAL HIGH (ref 70–99)
Potassium: 4 mmol/L (ref 3.5–5.1)
Sodium: 136 mmol/L (ref 135–145)

## 2019-03-26 LAB — HEMOGLOBIN AND HEMATOCRIT, BLOOD
HCT: 28.8 % — ABNORMAL LOW (ref 36.0–46.0)
Hemoglobin: 8.4 g/dL — ABNORMAL LOW (ref 12.0–15.0)

## 2019-03-26 MED ORDER — HYDROMORPHONE HCL 2 MG PO TABS
2.0000 mg | ORAL_TABLET | ORAL | Status: DC | PRN
  Administered 2019-03-26: 2 mg via ORAL
  Administered 2019-03-26: 4 mg via ORAL
  Administered 2019-03-26: 2 mg via ORAL
  Administered 2019-03-27 (×3): 4 mg via ORAL
  Filled 2019-03-26: qty 2
  Filled 2019-03-26 (×3): qty 1
  Filled 2019-03-26: qty 2
  Filled 2019-03-26: qty 1
  Filled 2019-03-26 (×2): qty 2

## 2019-03-26 MED ORDER — LIDOCAINE 5 % EX PTCH
2.0000 | MEDICATED_PATCH | CUTANEOUS | Status: DC
  Administered 2019-03-26 – 2019-03-27 (×2): 2 via TRANSDERMAL
  Filled 2019-03-26 (×2): qty 2

## 2019-03-26 NOTE — Progress Notes (Signed)
Patient in a lot of pain, unable to ambulate to night.

## 2019-03-26 NOTE — Progress Notes (Signed)
Urology Progress Note   1 Day Post-Op s/p right PCNL  Subjective: NAEON. Pain not well controlled with pain medications. Tolerating CLD. Passing flatus.    Objective: Vital signs in last 24 hours: Temp:  [98 F (36.7 C)-99.4 F (37.4 C)] 99.3 F (37.4 C) (06/09 0420) Pulse Rate:  [78-90] 85 (06/09 0420) Resp:  [12-20] 18 (06/09 0420) BP: (101-126)/(64-89) 119/72 (06/09 0420) SpO2:  [96 %-100 %] 98 % (06/09 0420) Weight:  [102.4 kg] 102.4 kg (06/08 2000)  Intake/Output from previous day: 06/08 0701 - 06/09 0700 In: 3415.8 [P.O.:180; I.V.:2735.8; IV Piggyback:500] Out: 1180 [Urine:1050; Blood:130] Intake/Output this shift: No intake/output data recorded.  Physical Exam:  General: Alert and oriented CV: HDS, regular rate Lungs: NWOB on RA Abdomen: Soft, non tender. Non distended. Tender over right flank access sites, but no palpable hematoma, dressing clean and dry GU: Foley in place draining light pink urine Ext: NT, No erythema  Lab Results: Recent Labs    03/25/19 1838 03/26/19 0438  HGB 9.1* 8.4*  HCT 32.4* 28.8*   BMET Recent Labs    03/25/19 1838 03/26/19 0438  NA 138 136  K 3.6 4.0  CL 109 108  CO2 18* 22  GLUCOSE 196* 156*  BUN 13 11  CREATININE 0.87 0.78  CALCIUM 7.7* 7.8*     Studies/Results: Ct Abdomen Pelvis Wo Contrast  Result Date: 03/26/2019 CLINICAL DATA:  RIGHT flank pain, suspected stone disease, hematuria, history of RIGHT percutaneous nephrostolithotomy, nausea, assess stone burden EXAM: CT ABDOMEN AND PELVIS WITHOUT CONTRAST TECHNIQUE: Multidetector CT imaging of the abdomen and pelvis was performed following the standard protocol without IV contrast. COMPARISON:  02/26/2019 FINDINGS: Lower chest: RIGHT pleural effusion with significant atelectasis of the adjacent RIGHT lower lobe. Minimal LEFT pleural effusion and basilar atelectasis. Hepatobiliary: Gallbladder and liver normal appearance Pancreas: Normal appearance Spleen: Normal  appearance Adrenals/Urinary Tract: Adrenal glands normal appearance. Small calculi at inferior pole LEFT kidney largest 4 mm diameter and 12 mm length. Large calculus at RIGHT renal pelvis 18 x 14 mm. Additional tiny calculi at mid inferior RIGHT kidney. Double-J RIGHT ureteral stent extends from RIGHT renal pelvis inferior to the calculus into urinary bladder. No ureteral calcifications or dilatation. Bladder decompressed by Foley catheter. Foci of air at the RIGHT renal collecting system and renal sinus related to preceding percutaneous procedure. Perinephric edema. Edema and gas also seen at the percutaneous tract extending to the RIGHT posterior flank. Stomach/Bowel: Normal appendix, retrocecal. Mobile cecum located at the midline of the upper pelvis. Stomach and bowel loops otherwise normal appearance. Vascular/Lymphatic: Aorta normal caliber.  No adenopathy. Reproductive: Unremarkable uterus and adnexa Other: Umbilical hernia containing fat. Musculoskeletal: Unremarkable IMPRESSION: Small nonobstructing calculi at inferior pole LEFT kidney. Post percutaneous nephrostolithotomy in stent placement. Multiple RIGHT renal calculi including a large 18 x 14 mm calculus at LEFT renal pelvis. Bibasilar pleural effusion due to atelectasis greater on RIGHT. Electronically Signed   By: Mark  Boles M.D.   On: 03/26/2019 08:32   Dg C-arm 1-60 Min-no Report  Result Date: 03/25/2019 Fluoroscopy was utilized by the requesting physician.  No radiographic interpretation.    Assessment/Plan:  38 y.o. female s/p right PCNL.  Overall doing well post-op, but pain is not well controlled.  - Advance to regular diet. Patient instructed to self-regulate if he develops nausea. - Medlock IVF. - Discontinue foley catheter and perform trial of void.  - Multi-modal pain regimen: continue IV tylenol, change oxycodone to po dilaudid and add lidocaine patches   for improved pain control. No toradol given bleeding risk.  - CT scan  shows persistent renal stone; discussed ureteroscopy with the patient for treatment of residual stone burden and she is in agreement. Will request procedure to be scheduled as an outpatient in 1-2 weeks.  - Continue bowel regimen. - DVT Prophylaxis: SCDs, early ambulated (3X today); no Baldwin given bleeding risk.  - If patient is meeting discharge goals this afternoon, plan for discharge. However, if pain is not improved, will plan to keep until tomorrow.     LOS: 0 days

## 2019-03-26 NOTE — Plan of Care (Addendum)
  Problem: Pain Managment: Goal: General experience of comfort will improve Outcome: Not Progressing  Trying to get pain under control, changes made with pain med management.

## 2019-03-26 NOTE — H&P (View-Only) (Signed)
Urology Progress Note   1 Day Post-Op s/p right PCNL  Subjective: NAEON. Pain not well controlled with pain medications. Tolerating CLD. Passing flatus.    Objective: Vital signs in last 24 hours: Temp:  [98 F (36.7 C)-99.4 F (37.4 C)] 99.3 F (37.4 C) (06/09 0420) Pulse Rate:  [78-90] 85 (06/09 0420) Resp:  [12-20] 18 (06/09 0420) BP: (101-126)/(64-89) 119/72 (06/09 0420) SpO2:  [96 %-100 %] 98 % (06/09 0420) Weight:  [102.4 kg] 102.4 kg (06/08 2000)  Intake/Output from previous day: 06/08 0701 - 06/09 0700 In: 3415.8 [P.O.:180; I.V.:2735.8; IV Piggyback:500] Out: 1180 [Urine:1050; Blood:130] Intake/Output this shift: No intake/output data recorded.  Physical Exam:  General: Alert and oriented CV: HDS, regular rate Lungs: NWOB on RA Abdomen: Soft, non tender. Non distended. Tender over right flank access sites, but no palpable hematoma, dressing clean and dry GU: Foley in place draining light pink urine Ext: NT, No erythema  Lab Results: Recent Labs    03/25/19 1838 03/26/19 0438  HGB 9.1* 8.4*  HCT 32.4* 28.8*   BMET Recent Labs    03/25/19 1838 03/26/19 0438  NA 138 136  K 3.6 4.0  CL 109 108  CO2 18* 22  GLUCOSE 196* 156*  BUN 13 11  CREATININE 0.87 0.78  CALCIUM 7.7* 7.8*     Studies/Results: Ct Abdomen Pelvis Wo Contrast  Result Date: 03/26/2019 CLINICAL DATA:  RIGHT flank pain, suspected stone disease, hematuria, history of RIGHT percutaneous nephrostolithotomy, nausea, assess stone burden EXAM: CT ABDOMEN AND PELVIS WITHOUT CONTRAST TECHNIQUE: Multidetector CT imaging of the abdomen and pelvis was performed following the standard protocol without IV contrast. COMPARISON:  02/26/2019 FINDINGS: Lower chest: RIGHT pleural effusion with significant atelectasis of the adjacent RIGHT lower lobe. Minimal LEFT pleural effusion and basilar atelectasis. Hepatobiliary: Gallbladder and liver normal appearance Pancreas: Normal appearance Spleen: Normal  appearance Adrenals/Urinary Tract: Adrenal glands normal appearance. Small calculi at inferior pole LEFT kidney largest 4 mm diameter and 12 mm length. Large calculus at RIGHT renal pelvis 18 x 14 mm. Additional tiny calculi at mid inferior RIGHT kidney. Double-J RIGHT ureteral stent extends from RIGHT renal pelvis inferior to the calculus into urinary bladder. No ureteral calcifications or dilatation. Bladder decompressed by Foley catheter. Foci of air at the RIGHT renal collecting system and renal sinus related to preceding percutaneous procedure. Perinephric edema. Edema and gas also seen at the percutaneous tract extending to the RIGHT posterior flank. Stomach/Bowel: Normal appendix, retrocecal. Mobile cecum located at the midline of the upper pelvis. Stomach and bowel loops otherwise normal appearance. Vascular/Lymphatic: Aorta normal caliber.  No adenopathy. Reproductive: Unremarkable uterus and adnexa Other: Umbilical hernia containing fat. Musculoskeletal: Unremarkable IMPRESSION: Small nonobstructing calculi at inferior pole LEFT kidney. Post percutaneous nephrostolithotomy in stent placement. Multiple RIGHT renal calculi including a large 18 x 14 mm calculus at LEFT renal pelvis. Bibasilar pleural effusion due to atelectasis greater on RIGHT. Electronically Signed   By: Ulyses SouthwardMark  Boles M.D.   On: 03/26/2019 08:32   Dg C-arm 1-60 Min-no Report  Result Date: 03/25/2019 Fluoroscopy was utilized by the requesting physician.  No radiographic interpretation.    Assessment/Plan:  38 y.o. female s/p right PCNL.  Overall doing well post-op, but pain is not well controlled.  - Advance to regular diet. Patient instructed to self-regulate if he develops nausea. - Medlock IVF. - Discontinue foley catheter and perform trial of void.  - Multi-modal pain regimen: continue IV tylenol, change oxycodone to po dilaudid and add lidocaine patches  for improved pain control. No toradol given bleeding risk.  - CT scan  shows persistent renal stone; discussed ureteroscopy with the patient for treatment of residual stone burden and she is in agreement. Will request procedure to be scheduled as an outpatient in 1-2 weeks.  - Continue bowel regimen. - DVT Prophylaxis: SCDs, early ambulated (3X today); no Baldwin given bleeding risk.  - If patient is meeting discharge goals this afternoon, plan for discharge. However, if pain is not improved, will plan to keep until tomorrow.     LOS: 0 days

## 2019-03-27 ENCOUNTER — Other Ambulatory Visit: Payer: Self-pay | Admitting: Urology

## 2019-03-27 LAB — CBC
HCT: 29.1 % — ABNORMAL LOW (ref 36.0–46.0)
Hemoglobin: 8.5 g/dL — ABNORMAL LOW (ref 12.0–15.0)
MCH: 23.8 pg — ABNORMAL LOW (ref 26.0–34.0)
MCHC: 29.2 g/dL — ABNORMAL LOW (ref 30.0–36.0)
MCV: 81.5 fL (ref 80.0–100.0)
Platelets: 220 10*3/uL (ref 150–400)
RBC: 3.57 MIL/uL — ABNORMAL LOW (ref 3.87–5.11)
RDW: 16.7 % — ABNORMAL HIGH (ref 11.5–15.5)
WBC: 10.8 10*3/uL — ABNORMAL HIGH (ref 4.0–10.5)
nRBC: 0 % (ref 0.0–0.2)

## 2019-03-27 LAB — BASIC METABOLIC PANEL
Anion gap: 7 (ref 5–15)
BUN: 8 mg/dL (ref 6–20)
CO2: 23 mmol/L (ref 22–32)
Calcium: 8.2 mg/dL — ABNORMAL LOW (ref 8.9–10.3)
Chloride: 104 mmol/L (ref 98–111)
Creatinine, Ser: 0.7 mg/dL (ref 0.44–1.00)
GFR calc Af Amer: >60 mL/min (ref >60)
GFR calc non Af Amer: >60 mL/min (ref >60)
Glucose, Bld: 132 mg/dL — ABNORMAL HIGH (ref 70–99)
Potassium: 3.6 mmol/L (ref 3.5–5.1)
Sodium: 134 mmol/L — ABNORMAL LOW (ref 135–145)

## 2019-03-27 MED ORDER — HYDROMORPHONE HCL 2 MG PO TABS: 2.0000 mg | ORAL_TABLET | ORAL | 0 refills | Status: AC | PRN

## 2019-03-27 NOTE — Discharge Summary (Signed)
[facility] Discharge Summary  Admit date: 03/25/2019  Discharge date and time: 03/27/19   Discharge to: Home  Discharge Service: Urology  Discharge Attending Physician: Dr. Berniece SalinesBenjamin Herrick  Discharge  Diagnoses: Right staghorn renal calculus   Secondary Diagnosis: Active Problems:   Nephrolithiasis   OR Procedures: Procedure(s): NEPHROLITHOTOMY PERCUTANEOUS WITH SURGEON ACCESS CYSTOSCOPY FLEXIBLE 03/25/2019   Ancillary Procedures: None   Discharge Day Services: The patient was seen and examined by the Urology team both in the morning and immediately prior to discharge.  Vital signs and laboratory values were stable and within normal limits.  The physical exam was benign and unchanged and all surgical wounds were examined.  Discharge instructions were explained and all questions answered.  Subjective  No acute events overnight. Pain Controlled. No fever or chills.  Objective Patient Vitals for the past 8 hrs:  BP Temp Temp src Pulse Resp SpO2  03/27/19 1238 134/87 99.3 F (37.4 C) Oral 88 19 96 %   Total I/O In: 240 [P.O.:240] Out: 500 [Urine:500]  General Appearance:        No acute distress Lungs:                       Normal work of breathing on room air Heart:                                Regular rate and rhythm Abdomen:                         Soft, appropriately tender obese abdomen Extremities:                      Warm and well perfused   Hospital Course:  The patient underwent right percutaneous nephrostolithotomy with right ureteral stent placement on 03/25/2019.  The patient tolerated the procedure well, was extubated in the OR, and afterwards was taken to the PACU for routine post-surgical care. When stable the patient was transferred to the floor.   The patient did well postoperatively. She did have pain control issues on POD1 and was switched to po dilaudid, which improved her pain. On POD2, she reported adequate pain control with po medications and  wanted to go home on po pain medications. The patient's diet was slowly advanced and at the time of discharge was tolerating a regular diet.  The patient was discharged home 2 Days Post-Op, at which point was tolerating a regular solid diet, was able to void spontaneously, have adequate pain control with P.O. pain medication, and could ambulate without difficulty. The patient will follow up with us for post op check.   CT on POD1 demonstrated persistent right stone burden and management options were discussed with the patient. Right ureteroscopic stone extraction was recommended and the patient agreed. She will be scheduled for this surgery in approximately 2 weeks. The patient does have permanent sutures at the 2 sites of percutaneous renal access and these will need to be removed at the follow-up surgery.   Condition at Discharge: Improved  Discharge Medications:  Allergies as of 03/27/2019   Not on File     Medication List    STOP taking these medications   cephALEXin 500 MG capsule Commonly known as:  KEFLEX     TAKE these medications   ferrous sulfate 325 (65 FE) MG tablet Commonly known as:  FerrouSul Take  1 tablet (325 mg total) by mouth 2 (two) times daily.   HYDROmorphone 2 MG tablet Commonly known as:  DILAUDID Take 1-2 tablets (2-4 mg total) by mouth every 4 (four) hours as needed for up to 5 days for severe pain.   ibuprofen 600 MG tablet Commonly known as:  ADVIL Take 1 tablet (600 mg total) by mouth every 6 (six) hours.   Prenatal Vitamins 0.8 MG tablet Take 1 tablet by mouth daily.

## 2019-03-27 NOTE — Plan of Care (Signed)
Discharge instructions reviewed with patient, questions answered, verbalized understanding.  Hardcopy script for Dilaudid given to patient. Instructions given to patient in [place] and [place].  Patient transported via wheelchair to main entrance of hospital to be taken home by son.

## 2019-03-28 LAB — URINE CULTURE: Culture: <10000 — AB

## 2019-04-05 LAB — CALCULI, WITH PHOTOGRAPH (CLINICAL LAB)
Carbonate Apatite: 30 %
Mg NH4 PO4 (Struvite): 70 %
Weight Calculi: 4391 mg

## 2019-04-05 NOTE — Patient Instructions (Addendum)
YOU ARE REQUIRED TO BE TESTED FOR COVID-19 PRIOR TO YOUR SURGERY . YOUR TEST MUST BE COMPLETED ON Monday June 22. TEST[facility] FROM 9:00AM - 3:00PM. FAILURE TO COMPLETE TESTING MAY RESULT IN CANCELLATION OF YOUR SURGERY. ONCE YOUR COVID TEST IS COMPLETED, PLEASE BEGIN THE QUARANTINE INSTRUCTIONS AS OUTLINED IN YOUR HANDOUT.               Ruth FaceSandra Stewart    Your procedure is scheduled on: 04-11-2019  [facility]  Report to admitting at 700 AM      Call this number if you have problems the morning of surgery [phone]    Remember: Do not eat food or drink liquids :After Midnight. BRUSH YOUR TEETH MORNING OF SURGERY AND RINSE YOUR MOUTH OUT, NO CHEWING GUM CANDY OR MINTS.     Take these medicines the morning of surgery with A SIP OF WATER: none                                You may not have any metal on your body including hair pins and              piercings  Do not wear jewelry, make-up, lotions, powders or perfumes, deodorant             Do not wear nail polish.  Do not shave  48 hours prior to surgery.              .   Do not bring valuables to the hospital.  IS NOT             RESPONSIBLE   FOR VALUABLES.  Contacts, dentures or bridgework may not be worn into surgery.  Leave suitcase in the car. After surgery it may be brought to your room.     Patients discharged the day of surgery will not be allowed to drive home. IF YOU ARE HAVING SURGERY AND GOING HOME THE SAME DAY, YOU MUST HAVE AN ADULT TO DRIVE YOU HOME AND BE WITH YOU FOR 24 HOURS. YOU MAY GO HOME BY TAXI OR UBER OR ORTHERWISE, BUT AN ADULT MUST ACCOMPANY YOU HOME AND STAY WITH YOU FOR 24 HOURS.  Name and phone number of your driver:  Special Instructions: N/A              Please read over the following fact sheets you were given: _____________________________________________________________________              [facility] Pre[facility] Before surgery, you can play an important role.  Because skin is not sterile, your skin needs to be as free of germs as possible.  You can reduce the number of germs on your skin by washing with CHG (chlorahexidine gluconate) soap before surgery.  CHG is an antiseptic cleaner which kills germs and bonds with the skin to continue killing germs even after washing. Please DO NOT use if you have an allergy to CHG or antibacterial soaps.  If your skin becomes reddened/irritated stop using the CHG and inform your nurse when you arrive at Short Stay. Do not shave (including legs and underarms) for at least 48 hours prior to the first CHG shower.  You may shave your Stewart/neck. Please follow these instructions carefully:  1.  Shower with CHG Soap the night before surgery and the  morning of Surgery.  2.  If you choose to wash your hair, wash your hair first as usual with your  normal  shampoo.  3.  After you shampoo, rinse your hair and body thoroughly to remove the  shampoo.                           4.  Use CHG as you would any other liquid soap.  You can apply chg directly  to the skin and wash                       Gently with a scrungie or clean washcloth.  5.  Apply the CHG Soap to your body ONLY FROM THE NECK DOWN.   Do not use on Stewart/ open                           Wound or open sores. Avoid contact with eyes, ears mouth and genitals (private parts).                       Wash Stewart,  Genitals (private parts) with your normal soap.             6.  Wash thoroughly, paying special attention to the area where your surgery  will be performed.  7.  Thoroughly rinse your body with warm water from the neck down.  8.  DO NOT shower/wash with your normal soap after using and rinsing off  the CHG Soap.                9.  Pat yourself dry with a clean towel.            10.  Wear clean pajamas.            11.  Place clean sheets on your bed the night of your first shower and  do not  sleep with pets. Day of Surgery : Do not apply any lotions/deodorants the morning of surgery.  Please wear clean clothes to the hospital/surgery center.  FAILURE TO FOLLOW THESE INSTRUCTIONS MAY RESULT IN THE CANCELLATION OF YOUR SURGERY PATIENT SIGNATURE_________________________________  NURSE SIGNATURE__________________________________  ________________________________________________________________________

## 2019-04-05 NOTE — Progress Notes (Signed)
email request for spanish interpreter on chart for 04-11-19 surgery

## 2019-04-08 ENCOUNTER — Other Ambulatory Visit: Payer: Self-pay

## 2019-04-08 ENCOUNTER — Encounter (HOSPITAL_COMMUNITY)
Admission: RE | Admit: 2019-04-08 | Discharge: 2019-04-08 | Disposition: A | Discharge status: Home / Self Care | Payer: Self-pay | Source: Ambulatory Visit | Attending: Urology | Admitting: Urology

## 2019-04-08 ENCOUNTER — Other Ambulatory Visit (HOSPITAL_COMMUNITY)
Admission: RE | Admit: 2019-04-08 | Discharge: 2019-04-08 | Disposition: A | Discharge status: Home / Self Care | Payer: HRSA Program | Source: Ambulatory Visit | Attending: Urology | Admitting: Urology

## 2019-04-08 DIAGNOSIS — Z1159 Encounter for screening for other viral diseases: Secondary | ICD-10-CM | POA: Diagnosis present

## 2019-04-08 DIAGNOSIS — Z20828 Contact with and (suspected) exposure to other viral communicable diseases: Secondary | ICD-10-CM | POA: Insufficient documentation

## 2019-04-08 LAB — SARS CORONAVIRUS 2 (TAT 6-24 HRS): SARS Coronavirus 2: NEGATIVE

## 2019-04-09 ENCOUNTER — Other Ambulatory Visit (HOSPITAL_COMMUNITY): Payer: Self-pay | Admitting: Emergency Medicine

## 2019-04-09 ENCOUNTER — Other Ambulatory Visit: Payer: Self-pay

## 2019-04-09 ENCOUNTER — Encounter (HOSPITAL_COMMUNITY): Payer: Self-pay | Admitting: Emergency Medicine

## 2019-04-09 DIAGNOSIS — IMO0001 Reserved for inherently not codable concepts without codable children: Secondary | ICD-10-CM

## 2019-04-09 HISTORY — DX: Reserved for inherently not codable concepts without codable children: IMO0001

## 2019-04-09 NOTE — Progress Notes (Signed)
Contacted patient to complete pre-op phone call with aid of spanish interpreter Hart Carwin [phone]. Patient reports surgical site pain ( kidney /flank area),  coughing , and fever that is present today but reports has improved since last surgery on 03-25-2019. Patient states via interpreter " the pain in my side where the surgery is gets agitated when I climb the stairs and this affects my breathing". Patient also reports that she has been taking 500mg  tylenol as needed for the pain. She also reports that her menses restarted after her her 6-8 surgery and that she feels pain with urination on the side where they did the surgery. RN  Consulted with APP Janett Billow, per Janett Billow: patient needs to contact her surgeon to advise her on surgical site pain . RN relayed this info. Patient also expresses concern about her fever. RN asked  if patient was having any chills, night sweats or symptoms of infection at her surgical site. Patient denies and reports via interpreter "the surgery side is all dried up, its good". RN advised patient to seek emergency medical help if her symptoms get worse and again to contact her her surgeon bout her surgical site pain

## 2019-04-09 NOTE — Progress Notes (Signed)
Patient arrived for pre-op appt, per admission employee patient complaining of coughing and sob. patient was brought back to PAT lab with mask on and temperature checked. Patient temp was 99.4. patient reports she has already had her covid test right before checking in for PAT appt. Patient denies travel , nor contact with symptomatic or covid positive persons. APP Janett Billow consulted and advised to send patient home while covid is pending and complete patient as a same day . Info was relayed to patient and RN advised patient to contact her primary if symptoms progressed and that RN would contact patient to complete pre-op interview over the phone . Patient verbalized understanding

## 2019-04-11 ENCOUNTER — Encounter (HOSPITAL_COMMUNITY)
Admission: RE | Disposition: A | Discharge status: Home / Self Care | Payer: Self-pay | Source: Home / Self Care | Attending: Urology

## 2019-04-11 ENCOUNTER — Other Ambulatory Visit: Payer: Self-pay

## 2019-04-11 ENCOUNTER — Ambulatory Visit (HOSPITAL_COMMUNITY)
Admission: RE | Admit: 2019-04-11 | Discharge: 2019-04-11 | Disposition: A | Discharge status: Home / Self Care | Payer: Self-pay | Attending: Urology | Admitting: Urology

## 2019-04-11 ENCOUNTER — Encounter (HOSPITAL_COMMUNITY): Payer: Self-pay | Admitting: *Deleted

## 2019-04-11 ENCOUNTER — Ambulatory Visit (HOSPITAL_COMMUNITY): Payer: Self-pay | Admitting: Anesthesiology

## 2019-04-11 ENCOUNTER — Ambulatory Visit (HOSPITAL_COMMUNITY): Payer: Self-pay | Admitting: Physician Assistant

## 2019-04-11 ENCOUNTER — Ambulatory Visit (HOSPITAL_COMMUNITY): Payer: Self-pay

## 2019-04-11 DIAGNOSIS — Z825 Family history of asthma and other chronic lower respiratory diseases: Secondary | ICD-10-CM | POA: Insufficient documentation

## 2019-04-11 DIAGNOSIS — J90 Pleural effusion, not elsewhere classified: Secondary | ICD-10-CM | POA: Insufficient documentation

## 2019-04-11 DIAGNOSIS — Z87442 Personal history of urinary calculi: Secondary | ICD-10-CM | POA: Insufficient documentation

## 2019-04-11 DIAGNOSIS — N200 Calculus of kidney: Secondary | ICD-10-CM | POA: Insufficient documentation

## 2019-04-11 DIAGNOSIS — D649 Anemia, unspecified: Secondary | ICD-10-CM | POA: Insufficient documentation

## 2019-04-11 DIAGNOSIS — K429 Umbilical hernia without obstruction or gangrene: Secondary | ICD-10-CM | POA: Insufficient documentation

## 2019-04-11 DIAGNOSIS — Z8249 Family history of ischemic heart disease and other diseases of the circulatory system: Secondary | ICD-10-CM | POA: Insufficient documentation

## 2019-04-11 HISTORY — PX: CYSTOSCOPY/URETEROSCOPY/HOLMIUM LASER/STENT PLACEMENT: SHX6546

## 2019-04-11 LAB — BASIC METABOLIC PANEL
Anion gap: 12 (ref 5–15)
BUN: 11 mg/dL (ref 6–20)
CO2: 18 mmol/L — ABNORMAL LOW (ref 22–32)
Calcium: 8.9 mg/dL (ref 8.9–10.3)
Chloride: 109 mmol/L (ref 98–111)
Creatinine, Ser: 0.68 mg/dL (ref 0.44–1.00)
GFR calc Af Amer: >60 mL/min (ref >60)
GFR calc non Af Amer: >60 mL/min (ref >60)
Glucose, Bld: 120 mg/dL — ABNORMAL HIGH (ref 70–99)
Potassium: 3.3 mmol/L — ABNORMAL LOW (ref 3.5–5.1)
Sodium: 139 mmol/L (ref 135–145)

## 2019-04-11 LAB — CBC
HCT: 33.5 % — ABNORMAL LOW (ref 36.0–46.0)
Hemoglobin: 9.9 g/dL — ABNORMAL LOW (ref 12.0–15.0)
MCH: 23.3 pg — ABNORMAL LOW (ref 26.0–34.0)
MCHC: 29.6 g/dL — ABNORMAL LOW (ref 30.0–36.0)
MCV: 79 fL — ABNORMAL LOW (ref 80.0–100.0)
Platelets: 437 10*3/uL — ABNORMAL HIGH (ref 150–400)
RBC: 4.24 MIL/uL (ref 3.87–5.11)
RDW: 16.6 % — ABNORMAL HIGH (ref 11.5–15.5)
WBC: 6.7 10*3/uL (ref 4.0–10.5)
nRBC: 0 % (ref 0.0–0.2)

## 2019-04-11 LAB — PREGNANCY, URINE: Preg Test, Ur: NEGATIVE

## 2019-04-11 SURGERY — CYSTOSCOPY/URETEROSCOPY/HOLMIUM LASER/STENT PLACEMENT
Anesthesia: General | Laterality: Right

## 2019-04-11 MED ORDER — ONDANSETRON HCL 4 MG/2ML IJ SOLN
INTRAMUSCULAR | Status: AC
  Filled 2019-04-11: qty 2

## 2019-04-11 MED ORDER — FENTANYL CITRATE (PF) 100 MCG/2ML IJ SOLN: 25.0000 ug | INTRAMUSCULAR | Status: DC | PRN

## 2019-04-11 MED ORDER — MEPERIDINE HCL 50 MG/ML IJ SOLN: 6.2500 mg | INTRAMUSCULAR | Status: DC | PRN

## 2019-04-11 MED ORDER — FAMOTIDINE 20 MG PO TABS
ORAL_TABLET | ORAL | Status: AC
  Administered 2019-04-11: 20 mg via ORAL
  Filled 2019-04-11: qty 1

## 2019-04-11 MED ORDER — ONDANSETRON HCL 4 MG/2ML IJ SOLN
INTRAMUSCULAR | Status: DC | PRN
  Administered 2019-04-11: 4 mg via INTRAVENOUS

## 2019-04-11 MED ORDER — LACTATED RINGERS IV SOLN
INTRAVENOUS | Status: DC
  Administered 2019-04-11: 07:00:00 via INTRAVENOUS

## 2019-04-11 MED ORDER — TAMSULOSIN HCL 0.4 MG PO CAPS: 0.4000 mg | ORAL_CAPSULE | Freq: Every day | ORAL | 0 refills | Status: DC

## 2019-04-11 MED ORDER — PHENYLEPHRINE 40 MCG/ML (10ML) SYRINGE FOR IV PUSH (FOR BLOOD PRESSURE SUPPORT)
PREFILLED_SYRINGE | INTRAVENOUS | Status: DC | PRN
  Administered 2019-04-11 (×5): 80 ug via INTRAVENOUS

## 2019-04-11 MED ORDER — OXYBUTYNIN CHLORIDE 5 MG PO TABS: 5.0000 mg | ORAL_TABLET | Freq: Three times a day (TID) | ORAL | 0 refills | Status: DC | PRN

## 2019-04-11 MED ORDER — ONDANSETRON HCL 4 MG/2ML IJ SOLN: 4.0000 mg | Freq: Once | INTRAMUSCULAR | Status: DC | PRN

## 2019-04-11 MED ORDER — DEXAMETHASONE SODIUM PHOSPHATE 10 MG/ML IJ SOLN
INTRAMUSCULAR | Status: AC
  Filled 2019-04-11: qty 1

## 2019-04-11 MED ORDER — CEFAZOLIN SODIUM-DEXTROSE 2-4 GM/100ML-% IV SOLN
2.0000 g | INTRAVENOUS | Status: AC
  Administered 2019-04-11: 09:00:00 2 g via INTRAVENOUS
  Filled 2019-04-11: qty 100

## 2019-04-11 MED ORDER — PROPOFOL 10 MG/ML IV BOLUS
INTRAVENOUS | Status: AC
  Filled 2019-04-11: qty 20

## 2019-04-11 MED ORDER — FENTANYL CITRATE (PF) 100 MCG/2ML IJ SOLN
INTRAMUSCULAR | Status: DC | PRN
  Administered 2019-04-11: 50 ug via INTRAVENOUS
  Administered 2019-04-11 (×2): 25 ug via INTRAVENOUS

## 2019-04-11 MED ORDER — FENTANYL CITRATE (PF) 100 MCG/2ML IJ SOLN
INTRAMUSCULAR | Status: AC
  Filled 2019-04-11: qty 2

## 2019-04-11 MED ORDER — MIDAZOLAM HCL 5 MG/5ML IJ SOLN
INTRAMUSCULAR | Status: DC | PRN
  Administered 2019-04-11: 2 mg via INTRAVENOUS

## 2019-04-11 MED ORDER — PHENYLEPHRINE 40 MCG/ML (10ML) SYRINGE FOR IV PUSH (FOR BLOOD PRESSURE SUPPORT)
PREFILLED_SYRINGE | INTRAVENOUS | Status: AC
  Filled 2019-04-11: qty 10

## 2019-04-11 MED ORDER — OXYCODONE-ACETAMINOPHEN 5-325 MG PO TABS: 1.0000 | ORAL_TABLET | ORAL | 0 refills | Status: DC | PRN

## 2019-04-11 MED ORDER — EPHEDRINE SULFATE-NACL 50-0.9 MG/10ML-% IV SOSY
PREFILLED_SYRINGE | INTRAVENOUS | Status: DC | PRN
  Administered 2019-04-11 (×3): 5 mg via INTRAVENOUS

## 2019-04-11 MED ORDER — ACETAMINOPHEN 325 MG PO TABS: 325.0000 mg | ORAL_TABLET | ORAL | Status: DC | PRN

## 2019-04-11 MED ORDER — OXYCODONE HCL 5 MG/5ML PO SOLN: 5.0000 mg | Freq: Once | ORAL | Status: DC | PRN

## 2019-04-11 MED ORDER — SODIUM CHLORIDE 0.9 % IR SOLN
Status: DC | PRN
  Administered 2019-04-11: 6000 mL

## 2019-04-11 MED ORDER — SODIUM CHLORIDE 0.9 % IV SOLN
INTRAVENOUS | Status: DC | PRN
  Administered 2019-04-11: 17 mL

## 2019-04-11 MED ORDER — SCOPOLAMINE 1 MG/3DAYS TD PT72
MEDICATED_PATCH | TRANSDERMAL | Status: AC
  Administered 2019-04-11: 1.5 mg via TRANSDERMAL
  Filled 2019-04-11: qty 1

## 2019-04-11 MED ORDER — EPHEDRINE 5 MG/ML INJ
INTRAVENOUS | Status: AC
  Filled 2019-04-11: qty 10

## 2019-04-11 MED ORDER — OXYCODONE HCL 5 MG PO TABS: 5.0000 mg | ORAL_TABLET | Freq: Once | ORAL | Status: DC | PRN

## 2019-04-11 MED ORDER — LIDOCAINE 2% (20 MG/ML) 5 ML SYRINGE
INTRAMUSCULAR | Status: AC
  Filled 2019-04-11: qty 5

## 2019-04-11 MED ORDER — DEXAMETHASONE SODIUM PHOSPHATE 10 MG/ML IJ SOLN
INTRAMUSCULAR | Status: DC | PRN
  Administered 2019-04-11: 8 mg via INTRAVENOUS

## 2019-04-11 MED ORDER — MIDAZOLAM HCL 2 MG/2ML IJ SOLN
INTRAMUSCULAR | Status: AC
  Filled 2019-04-11: qty 2

## 2019-04-11 MED ORDER — LIDOCAINE 2% (20 MG/ML) 5 ML SYRINGE
INTRAMUSCULAR | Status: DC | PRN
  Administered 2019-04-11: 80 mg via INTRAVENOUS

## 2019-04-11 MED ORDER — FAMOTIDINE 20 MG PO TABS
20.0000 mg | ORAL_TABLET | Freq: Once | ORAL | Status: AC
  Administered 2019-04-11: 20 mg via ORAL

## 2019-04-11 MED ORDER — ACETAMINOPHEN 160 MG/5ML PO SOLN: 325.0000 mg | ORAL | Status: DC | PRN

## 2019-04-11 MED ORDER — SCOPOLAMINE 1 MG/3DAYS TD PT72
1.0000 | MEDICATED_PATCH | Freq: Once | TRANSDERMAL | Status: DC
  Administered 2019-04-11: 1.5 mg via TRANSDERMAL

## 2019-04-11 MED ORDER — PROPOFOL 10 MG/ML IV BOLUS
INTRAVENOUS | Status: DC | PRN
  Administered 2019-04-11: 200 mg via INTRAVENOUS

## 2019-04-11 SURGICAL SUPPLY — 30 items
BAG URO CATCHER STRL LF (MISCELLANEOUS) ×3 IMPLANT
BASKET ZERO TIP NITINOL 2.4FR (BASKET) IMPLANT
CATH URET 5FR 28IN OPEN ENDED (CATHETERS) ×3 IMPLANT
CATH URET DUAL LUMEN 6-10FR 50 (CATHETERS) ×2 IMPLANT
CLOTH BEACON ORANGE TIMEOUT ST (SAFETY) ×3 IMPLANT
COVER WAND RF STERILE (DRAPES) IMPLANT
EXTRACTOR STONE 1.7FRX115CM (UROLOGICAL SUPPLIES) ×2 IMPLANT
FIBER LASER TRAC TIP (UROLOGICAL SUPPLIES) ×2 IMPLANT
GLOVE BIO SURGEON STRL SZ7.5 (GLOVE) ×2 IMPLANT
GLOVE BIOGEL M STRL SZ7.5 (GLOVE) ×3 IMPLANT
GLOVE BIOGEL PI IND STRL 6.5 (GLOVE) IMPLANT
GLOVE BIOGEL PI IND STRL 7.5 (GLOVE) IMPLANT
GLOVE BIOGEL PI INDICATOR 6.5 (GLOVE) ×2
GLOVE BIOGEL PI INDICATOR 7.5 (GLOVE) ×2
GLOVE SURG SS PI 6.5 STRL IVOR (GLOVE) ×2 IMPLANT
GOWN STRL REUS W/ TWL LRG LVL3 (GOWN DISPOSABLE) IMPLANT
GOWN STRL REUS W/TWL LRG LVL3 (GOWN DISPOSABLE) ×2
GOWN STRL REUS W/TWL XL LVL3 (GOWN DISPOSABLE) ×5 IMPLANT
GUIDEWIRE ANG ZIPWIRE 038X150 (WIRE) IMPLANT
GUIDEWIRE STR DUAL SENSOR (WIRE) ×3 IMPLANT
KIT TURNOVER KIT A (KITS) ×2 IMPLANT
MANIFOLD NEPTUNE II (INSTRUMENTS) ×3 IMPLANT
PACK CYSTO (CUSTOM PROCEDURE TRAY) ×3 IMPLANT
SHEATH URETERAL 12FRX28CM (UROLOGICAL SUPPLIES) IMPLANT
SHEATH URETERAL 12FRX35CM (MISCELLANEOUS) ×2 IMPLANT
STENT CONTOUR 7FRX24 (STENTS) ×2 IMPLANT
TUBING CONNECTING 10 (TUBING) ×2 IMPLANT
TUBING CONNECTING 10' (TUBING) ×1
TUBING UROLOGY SET (TUBING) ×3 IMPLANT
WIRE COONS/BENSON .038X145CM (WIRE) ×2 IMPLANT

## 2019-04-11 NOTE — Anesthesia Procedure Notes (Signed)
Procedure Name: LMA Insertion Date/Time: 04/11/2019 8:30 AM Performed by: Victoriano Lain, CRNA Pre-anesthesia Checklist: Patient identified, Emergency Drugs available, Patient being monitored, Timeout performed and Suction available Patient Re-evaluated:Patient Re-evaluated prior to induction Oxygen Delivery Method: Circle system utilized Preoxygenation: Pre-oxygenation with 100% oxygen Induction Type: IV induction Ventilation: Mask ventilation without difficulty LMA: LMA with gastric port inserted LMA Size: 4.0 Number of attempts: 1 Placement Confirmation: positive ETCO2 and breath sounds checked- equal and bilateral Tube secured with: Tape Dental Injury: Teeth and Oropharynx as per pre-operative assessment

## 2019-04-11 NOTE — Progress Notes (Signed)
Interpreter present

## 2019-04-11 NOTE — Anesthesia Postprocedure Evaluation (Signed)
Anesthesia Post Note  Patient: Ruth Stewart  Procedure(s) Performed: CYSTOSCOPY RIGHT URETEROSCOPY WITH HOLMIUM LASER STONE EXTRACTION STENT EXCHANGE (Right )     Patient location during evaluation: PACU Anesthesia Type: General Level of consciousness: awake and alert Pain management: pain level controlled Vital Signs Assessment: post-procedure vital signs reviewed and stable Respiratory status: spontaneous breathing, nonlabored ventilation, respiratory function stable and patient connected to nasal cannula oxygen Cardiovascular status: blood pressure returned to baseline and stable Postop Assessment: no apparent nausea or vomiting Anesthetic complications: no    Last Vitals:  Vitals:   04/11/19 1130 04/11/19 1131  BP:  111/83  Pulse:  98  Resp:    Temp:  36.8 C  SpO2: 97% 97%    Last Pain:  Vitals:   04/11/19 1131  TempSrc:   PainSc: 0-No pain                 Teshara Moree

## 2019-04-11 NOTE — Op Note (Addendum)
Urology Operative Note:  Preoperative Diagnosis: Right nephrolithiasis  Postoperative Diagnosis:  Same  Procedure(s) Performed:   - Cystourethroscopy - Right ureteroscopic stone extraction with laser lithotripsy - Right retrograde pyelogram - Right ureteral stent removal  - Right ureteral stent placement - Intraoperative fluoroscopy with interpretation <1hr.   Teaching Surgeon:  Berniece SalinesBenjamin Herrick, MD  Resident Surgeon:  Federico FlakeSolomon Hayon, MD  Assistant(s):  None  Anesthesia:  General  Fluids:  See anesthesia record  Estimated blood loss:  0cc  Specimens:  Stone for analysis  Drains:  Right 7Fr x 24cm JJ ureteral stent withOUT dangler  Complications:  None  Indications: 38 y.o. patient with a history of right sided stone. Underwent right PCNL on 6/8 with 1.8cm residual stone on post op CT. Presents today for 2nd stage USE. Risks & benefits of the procedure discussed with the patient, who wishes to proceed.  Findings:   - Encrusted right stent. Unable to wire stent but removed easily -  Large stone in right renal pelvis. Dusted into pieces smaller than size of wire - All sizeable fragments were removed with basket extraction - Due to large stone burden decision made to upsize stent and leave in place for 2 weeks to ensure passage of stone fragments  Radiologic Interpretation of Retrograde Pyelogram: Right retrograde pyelogram demonstrated bifed system with medial upper pole infundibulum. After lithotripsy there was no contrast extravasation, filling defects or evidence of hydroureteronephrosis.  Description:  The patient was correctly identified in the preop holding area where written informed consent as well potential risk and complication reviewed. The patient agreed. They were brought back to the operative suite where a preinduction timeout was performed. Once correct information was verified, general anesthesia was induced. They were then gently placed into dorsal lithotomy  position with SCDs in place for VTE prophylaxis. They were prepped and draped in the usual sterile fashion and given appropriate preoperative antibiotics. A second timeout was then performed.   We inserted a 48F rigid cystoscope per urethra with copious lubrication and normal saline irrigation running. This demonstrated findings as described above.    Existing stent - We turned our attention to the right ureteral orifice with a ureteral stent emanating with  encrustation. We grasped the distal end of the ureteral stent and brought it to the ureteral orifice with the flexible graspers. Attempted to cannulate with sensor wire but unable due to encrustation.  We then cannulated the right ureteral orifice and sensor wire was advanced into the expected location of the renal pelvis without difficulty. Existing stent was then removed, keeping the wire in place.  A 8-6810fr taper dual lumen catheter was advanced without difficulty into the proximal ureter. Retrograde pyelogram was performed with findingd as above.  We were able to visualize a radioopacity on fluoroscopy in the renal pelvis.  We then obtained a 12-14Fr x 35cm ureteral access sheath which was easily passed over the sensor wire into the proximal ureter just distal to the UPJ under fluoroscopic guidance. The inner cannula was removed and was replaced by the flexible ureteroscope and ureteroscopy was performed. This demonstrated single large stone in renal pelvis and several small stones in the lower pole. Lower pole fragments removed with basket extraction.    Wethen obtained a 200 micron holmium laser fiber and performed laser lithotripsy until all of the stone burden was dusted into fragments smaller than the with of the sensor wire.  We then re-explored the location of stone treatment which demonstrated no remaining large fragments. At  this point we elected to leave a ureteral stent and withdrew our instruments leaving a sensor wire in place in  the interpolar calyx.   We then advanced a 7Fr x 24cm JJ ureteral stent withOUT dangler with the assistance of a stent pusher under direct fluoroscopic & visual guidance  without difficultly. Sensor wire removal demonstrated satisfactory stent curl proximally in the renal pelvis and distally in the bladder. The bladder was emptied and all instrumentation was removed.   The patient was woken up from anesthesia and taken to the recovery unit for routine postoperative care.   Post Op Plan:   1. Discharge when voids x1 2. Return in 2 weeks for cysto stent removal 3. Ditropan, flomax, Percocet 4. 6 week PCNL follow up with [facility] is already scheduled for July

## 2019-04-11 NOTE — H&P (Signed)
Urology H+P Note   Chief Complaint:  Right nephrolithiasis  HPI: Ruth Stewart is a 38 y.o. female who is here for further eval and management of kidney stones.  She was diagnosed with a kidney stone on approximately 02/11/2019. Underwent right PCNL on 6/8. Post op CT with residual stone. Here today for 2nd look ureteroscopy. Doing well with stent.    Past Medical History: Past Medical History:  Diagnosis Date  . Cervical dysplasia   . History of kidney stones   . Iron deficiency anemia    with pregnancy  . Medical history reviewed with no changes 04/09/2019    Past Surgical History:  Past Surgical History:  Procedure Laterality Date  . CYSTOSCOPY N/A 03/25/2019   Procedure: CYSTOSCOPY FLEXIBLE;  Surgeon: Ardis Hughs, MD;  Location: [facility] ORS;  Service: Urology;  Laterality: N/A;  . LEEP  2011  . NEPHROLITHOTOMY Right 03/25/2019   Procedure: NEPHROLITHOTOMY PERCUTANEOUS WITH SURGEON ACCESS;  Surgeon: Ardis Hughs, MD;  Location: [facility] ORS;  Service: Urology;  Laterality: Right;    Medication: Current Facility-Administered Medications  Medication Dose Route Frequency Provider Last Rate Last Dose  . ceFAZolin (ANCEF) IVPB 2g/100 mL premix  2 g Intravenous 30 min Pre-Op Ardis Hughs, MD      . lactated ringers infusion   Intravenous Continuous Janeece Riggers, MD 50 mL/hr at 04/11/19 0720      Allergies: No Known Allergies  Social History: Social History   Tobacco Use  . Smoking status: Never Smoker  . Smokeless tobacco: Never Used  Substance Use Topics  . Alcohol use: Yes    Comment: sometimes  . Drug use: No    Family History Family History  Problem Relation Age of Onset  . Hypertension Mother     Review of Systems 10 systems were reviewed and are negative except as noted specifically in the HPI.  Objective   Vital signs in last 24 hours: BP 116/86   Pulse 96   Temp 98.7 F (37.1 C) (Oral)   Ht 5\' 2"  (1.575 m)   Wt 94.5 kg   LMP  03/26/2019 (Approximate)   SpO2 99%   BMI 38.12 kg/m   Physical Exam General: NAD, A&O, resting, appropriate HEENT: [place]/AT, EOMI, MMM Pulmonary: Normal work of breathing Cardiovascular: HDS, adequate peripheral perfusion Abdomen: Soft, NTTP, nondistended, . GU:  NO CVA tenderness Extremities: warm and well perfused Neuro: Appropriate, no focal neurological deficits  Most Recent Labs: Lab Results  Component Value Date   WBC 6.7 04/11/2019   HGB 9.9 (L) 04/11/2019   HCT 33.5 (L) 04/11/2019   PLT 437 (H) 04/11/2019    Lab Results  Component Value Date   NA 139 04/11/2019   K 3.3 (L) 04/11/2019   CL 109 04/11/2019   CO2 18 (L) 04/11/2019   BUN 11 04/11/2019   CREATININE 0.68 04/11/2019   CALCIUM 8.9 04/11/2019    No results found for: INR, APTT   IMAGING: No results found.  ------  Assessment:  38 y.o. female with residual right sided kidney stone   Plan: - Right ureteroscopic stone extraction with laser lithotripsy

## 2019-04-11 NOTE — Transfer of Care (Signed)
Immediate Anesthesia Transfer of Care Note  Patient: Ruth Stewart  Procedure(s) Performed: CYSTOSCOPY RIGHT URETEROSCOPY WITH HOLMIUM LASER STONE EXTRACTION STENT EXCHANGE (Right )  Patient Location: PACU  Anesthesia Type:General  Level of Consciousness: awake, alert , oriented and patient cooperative  Airway & Oxygen Therapy: Patient Spontanous Breathing and Patient connected to face mask oxygen  Post-op Assessment: Report given to RN, Post -op Vital signs reviewed and stable and Patient moving all extremities  Post vital signs: Reviewed and stable  Last Vitals:  Vitals Value Taken Time  BP 116/70 04/11/19 1020  Temp    Pulse 86 04/11/19 1021  Resp 15 04/11/19 1021  SpO2 100 % 04/11/19 1021  Vitals shown include unvalidated device data.  Last Pain:  Vitals:   04/11/19 0655  TempSrc: Oral  PainSc: 3       Patients Stated Pain Goal: 4 (22/63/33 [id number])  Complications: No apparent anesthesia complications

## 2019-04-11 NOTE — Interval H&P Note (Signed)
History and Physical Interval Note:  04/11/2019 8:04 AM  Ruth Stewart  has presented today for surgery, with the diagnosis of SIDNEY STONE.  The various methods of treatment have been discussed with the patient and family. After consideration of risks, benefits and other options for treatment, the patient has consented to  Procedure(s): CYSTOSCOPY RIGHT URETEROSCOPY WITH HOLMIUM LASER STONE EXTRACTION STENT EXCHANGE (Right) as a surgical intervention.  The patient's history has been reviewed, patient examined, no change in status, stable for surgery.  I have reviewed the patient's chart and labs.  Questions were answered to the patient's satisfaction.     Tharon Aquas

## 2019-04-11 NOTE — Anesthesia Preprocedure Evaluation (Addendum)
Anesthesia Evaluation  Patient identified by MRN, date of birth, ID band Patient awake    Reviewed: Allergy & Precautions, NPO status , Patient's Chart, lab work & pertinent test results  Airway Mallampati: II  TM Distance: >3 FB Neck ROM: Full    Dental no notable dental hx. (+) Teeth Intact, Dental Advisory Given   Pulmonary neg pulmonary ROS,    Pulmonary exam normal breath sounds clear to auscultation       Cardiovascular negative cardio ROS Normal cardiovascular exam Rhythm:Regular Rate:Normal     Neuro/Psych negative neurological ROS  negative psych ROS   GI/Hepatic negative GI ROS, Neg liver ROS,   Endo/Other  negative endocrine ROS  Renal/GU negative Renal ROS  negative genitourinary   Musculoskeletal negative musculoskeletal ROS (+)   Abdominal   Peds  Hematology  (+) Blood dyscrasia, anemia ,   Anesthesia Other Findings   Reproductive/Obstetrics negative OB ROS                             Anesthesia Physical  Anesthesia Plan  ASA: II  Anesthesia Plan: General   Post-op Pain Management:    Induction: Intravenous  PONV Risk Score and Plan: 3 and Midazolam, Dexamethasone, Ondansetron and Scopolamine patch - Pre-op  Airway Management Planned: Oral ETT and LMA  Additional Equipment:   Intra-op Plan:   Post-operative Plan: Extubation in OR  Informed Consent: I have reviewed the patients History and Physical, chart, labs and discussed the procedure including the risks, benefits and alternatives for the proposed anesthesia with the patient or authorized representative who has indicated his/her understanding and acceptance.     Dental advisory given  Plan Discussed with: CRNA, Anesthesiologist and Surgeon  Anesthesia Plan Comments:        Anesthesia Quick Evaluation

## 2019-04-11 NOTE — Interval H&P Note (Deleted)
History and Physical Interval Note:  04/11/2019 7:50 AM  Ruth Stewart  has presented today for surgery, with the diagnosis of KIDNEY STONE.  The various methods of treatment have been discussed with the patient and family. After consideration of risks, benefits and other options for treatment, the patient has consented to  Procedure(s): CYSTOSCOPY RIGHT URETEROSCOPY WITH HOLMIUM LASER STONE EXTRACTION STENT EXCHANGE (Right) as a surgical intervention.  The patient's history has been reviewed, patient examined, no change in status, stable for surgery.  I have reviewed the patient's chart and labs.  Questions were answered to the patient's satisfaction.     Tharon Aquas

## 2019-04-12 ENCOUNTER — Encounter (HOSPITAL_COMMUNITY): Payer: Self-pay | Admitting: Urology

## 2019-09-18 ENCOUNTER — Other Ambulatory Visit: Payer: Self-pay

## 2019-09-18 DIAGNOSIS — Z1231 Encounter for screening mammogram for malignant neoplasm of breast: Secondary | ICD-10-CM

## 2019-09-24 ENCOUNTER — Other Ambulatory Visit: Payer: Self-pay | Admitting: Nurse Practitioner

## 2019-09-24 DIAGNOSIS — Z1231 Encounter for screening mammogram for malignant neoplasm of breast: Secondary | ICD-10-CM

## 2019-11-14 ENCOUNTER — Other Ambulatory Visit: Payer: Self-pay

## 2019-11-14 ENCOUNTER — Ambulatory Visit
Admission: RE | Admit: 2019-11-14 | Discharge: 2019-11-14 | Disposition: A | Discharge status: Home / Self Care | Payer: No Typology Code available for payment source | Source: Ambulatory Visit | Attending: Nurse Practitioner | Admitting: Nurse Practitioner

## 2019-11-14 DIAGNOSIS — Z1231 Encounter for screening mammogram for malignant neoplasm of breast: Secondary | ICD-10-CM

## 2020-11-06 ENCOUNTER — Other Ambulatory Visit: Payer: Self-pay | Admitting: Obstetrics and Gynecology

## 2020-11-06 DIAGNOSIS — Z1231 Encounter for screening mammogram for malignant neoplasm of breast: Secondary | ICD-10-CM

## 2020-11-30 ENCOUNTER — Other Ambulatory Visit: Payer: Self-pay

## 2020-11-30 ENCOUNTER — Encounter (HOSPITAL_COMMUNITY): Payer: Self-pay

## 2020-11-30 ENCOUNTER — Observation Stay (HOSPITAL_COMMUNITY): Payer: No Typology Code available for payment source

## 2020-11-30 ENCOUNTER — Emergency Department (HOSPITAL_COMMUNITY): Payer: No Typology Code available for payment source

## 2020-11-30 ENCOUNTER — Observation Stay (HOSPITAL_COMMUNITY)
Admission: EM | Admit: 2020-11-30 | Discharge: 2020-12-01 | Disposition: A | Discharge status: Home / Self Care | Payer: No Typology Code available for payment source | Attending: Urology | Admitting: Urology

## 2020-11-30 DIAGNOSIS — N139 Obstructive and reflux uropathy, unspecified: Secondary | ICD-10-CM

## 2020-11-30 DIAGNOSIS — Z20822 Contact with and (suspected) exposure to covid-19: Secondary | ICD-10-CM | POA: Insufficient documentation

## 2020-11-30 DIAGNOSIS — N200 Calculus of kidney: Principal | ICD-10-CM | POA: Insufficient documentation

## 2020-11-30 DIAGNOSIS — N23 Unspecified renal colic: Secondary | ICD-10-CM

## 2020-11-30 DIAGNOSIS — N1330 Unspecified hydronephrosis: Secondary | ICD-10-CM

## 2020-11-30 HISTORY — PX: IR NEPHROSTOMY PLACEMENT LEFT: IMG6063

## 2020-11-30 LAB — URINALYSIS, ROUTINE W REFLEX MICROSCOPIC
Bilirubin Urine: NEGATIVE
Glucose, UA: NEGATIVE mg/dL
Ketones, ur: NEGATIVE mg/dL
Nitrite: NEGATIVE
Protein, ur: 100 mg/dL — AB
Specific Gravity, Urine: 1.025 (ref 1.005–1.030)
pH: 6.5 (ref 5.0–8.0)

## 2020-11-30 LAB — COMPREHENSIVE METABOLIC PANEL
ALT: 18 U/L (ref 0–44)
AST: 17 U/L (ref 15–41)
Albumin: 3.7 g/dL (ref 3.5–5.0)
Alkaline Phosphatase: 84 U/L (ref 38–126)
Anion gap: 12 (ref 5–15)
BUN: 21 mg/dL — ABNORMAL HIGH (ref 6–20)
CO2: 23 mmol/L (ref 22–32)
Calcium: 8.8 mg/dL — ABNORMAL LOW (ref 8.9–10.3)
Chloride: 107 mmol/L (ref 98–111)
Creatinine, Ser: 1.02 mg/dL — ABNORMAL HIGH (ref 0.44–1.00)
GFR, Estimated: >60 mL/min (ref >60)
Glucose, Bld: 144 mg/dL — ABNORMAL HIGH (ref 70–99)
Potassium: 3.2 mmol/L — ABNORMAL LOW (ref 3.5–5.1)
Sodium: 142 mmol/L (ref 135–145)
Total Bilirubin: 0.8 mg/dL (ref 0.3–1.2)
Total Protein: 6.9 g/dL (ref 6.5–8.1)

## 2020-11-30 LAB — CBC WITH DIFFERENTIAL/PLATELET
Abs Immature Granulocytes: 0.02 10*3/uL (ref 0.00–0.07)
Basophils Absolute: 0 10*3/uL (ref 0.0–0.1)
Basophils Relative: 0 %
Eosinophils Absolute: 0 10*3/uL (ref 0.0–0.5)
Eosinophils Relative: 0 %
HCT: 38.1 % (ref 36.0–46.0)
Hemoglobin: 11.9 g/dL — ABNORMAL LOW (ref 12.0–15.0)
Immature Granulocytes: 0 %
Lymphocytes Relative: 21 %
Lymphs Abs: 1.2 10*3/uL (ref 0.7–4.0)
MCH: 28.3 pg (ref 26.0–34.0)
MCHC: 31.2 g/dL (ref 30.0–36.0)
MCV: 90.5 fL (ref 80.0–100.0)
Monocytes Absolute: 0 10*3/uL — ABNORMAL LOW (ref 0.1–1.0)
Monocytes Relative: 1 %
Neutro Abs: 4.6 10*3/uL (ref 1.7–7.7)
Neutrophils Relative %: 78 %
Platelets: 197 10*3/uL (ref 150–400)
RBC: 4.21 MIL/uL (ref 3.87–5.11)
RDW: 14.8 % (ref 11.5–15.5)
WBC: 5.9 10*3/uL (ref 4.0–10.5)
nRBC: 0 % (ref 0.0–0.2)

## 2020-11-30 LAB — URINALYSIS, MICROSCOPIC (REFLEX)

## 2020-11-30 LAB — PROTIME-INR
INR: 1.1 (ref 0.8–1.2)
Prothrombin Time: 13.3 seconds (ref 11.4–15.2)

## 2020-11-30 LAB — LACTIC ACID, PLASMA: Lactic Acid, Venous: 1.8 mmol/L (ref 0.5–1.9)

## 2020-11-30 LAB — LIPASE, BLOOD: Lipase: 26 U/L (ref 11–51)

## 2020-11-30 LAB — PREGNANCY, URINE: Preg Test, Ur: NEGATIVE

## 2020-11-30 LAB — RESP PANEL BY RT-PCR (FLU A&B, COVID) ARPGX2
Influenza A by PCR: NEGATIVE
Influenza B by PCR: NEGATIVE
SARS Coronavirus 2 by RT PCR: NEGATIVE

## 2020-11-30 LAB — HIV ANTIBODY (ROUTINE TESTING W REFLEX): HIV Screen 4th Generation wRfx: NONREACTIVE

## 2020-11-30 MED ORDER — SENNOSIDES-DOCUSATE SODIUM 8.6-50 MG PO TABS: 1.0000 | ORAL_TABLET | Freq: Every evening | ORAL | Status: DC | PRN

## 2020-11-30 MED ORDER — ONDANSETRON HCL 4 MG/2ML IJ SOLN
4.0000 mg | Freq: Once | INTRAMUSCULAR | Status: AC
  Administered 2020-11-30: 4 mg via INTRAVENOUS
  Filled 2020-11-30: qty 2

## 2020-11-30 MED ORDER — LIDOCAINE HCL 1 % IJ SOLN
INTRAMUSCULAR | Status: AC
  Filled 2020-11-30: qty 20

## 2020-11-30 MED ORDER — DIPHENHYDRAMINE HCL 50 MG/ML IJ SOLN: 12.5000 mg | Freq: Four times a day (QID) | INTRAMUSCULAR | Status: DC | PRN

## 2020-11-30 MED ORDER — HYDROMORPHONE HCL 1 MG/ML IJ SOLN
0.5000 mg | Freq: Once | INTRAMUSCULAR | Status: AC
  Administered 2020-11-30: 0.5 mg via INTRAVENOUS
  Filled 2020-11-30: qty 1

## 2020-11-30 MED ORDER — POTASSIUM CHLORIDE IN NACL 20-0.45 MEQ/L-% IV SOLN
INTRAVENOUS | Status: DC
  Filled 2020-11-30 (×4): qty 1000

## 2020-11-30 MED ORDER — ONDANSETRON HCL 4 MG/2ML IJ SOLN
4.0000 mg | INTRAMUSCULAR | Status: DC | PRN
  Administered 2020-12-01: 07:00:00 4 mg via INTRAVENOUS
  Filled 2020-11-30: qty 2

## 2020-11-30 MED ORDER — MIDAZOLAM HCL 2 MG/2ML IJ SOLN
INTRAMUSCULAR | Status: AC | PRN
  Administered 2020-11-30 (×2): 1 mg via INTRAVENOUS

## 2020-11-30 MED ORDER — SODIUM CHLORIDE 0.9 % IV SOLN
1.0000 g | Freq: Once | INTRAVENOUS | Status: AC
  Administered 2020-11-30: 1 g via INTRAVENOUS
  Filled 2020-11-30: qty 10

## 2020-11-30 MED ORDER — ACETAMINOPHEN 325 MG PO TABS
650.0000 mg | ORAL_TABLET | ORAL | Status: DC | PRN
  Administered 2020-12-01: 03:00:00 650 mg via ORAL
  Filled 2020-11-30: qty 2

## 2020-11-30 MED ORDER — LIDOCAINE HCL (PF) 1 % IJ SOLN
INTRAMUSCULAR | Status: AC | PRN
  Administered 2020-11-30: 10 mL

## 2020-11-30 MED ORDER — FLEET ENEMA 7-19 GM/118ML RE ENEM: 1.0000 | ENEMA | Freq: Once | RECTAL | Status: DC | PRN

## 2020-11-30 MED ORDER — MIDAZOLAM HCL 2 MG/2ML IJ SOLN
INTRAMUSCULAR | Status: AC
  Filled 2020-11-30: qty 4

## 2020-11-30 MED ORDER — CEFAZOLIN SODIUM-DEXTROSE 2-4 GM/100ML-% IV SOLN: 2.0000 g | INTRAVENOUS | Status: DC

## 2020-11-30 MED ORDER — SODIUM CHLORIDE 0.9 % IV SOLN: 1.0000 g | Freq: Once | INTRAVENOUS | Status: DC

## 2020-11-30 MED ORDER — FENTANYL CITRATE (PF) 100 MCG/2ML IJ SOLN
INTRAMUSCULAR | Status: AC
  Filled 2020-11-30: qty 2

## 2020-11-30 MED ORDER — BISACODYL 10 MG RE SUPP: 10.0000 mg | Freq: Every day | RECTAL | Status: DC | PRN

## 2020-11-30 MED ORDER — HYDROMORPHONE HCL 1 MG/ML IJ SOLN
0.5000 mg | INTRAMUSCULAR | Status: DC | PRN
  Administered 2020-11-30 (×2): 1 mg via INTRAVENOUS
  Filled 2020-11-30 (×2): qty 1

## 2020-11-30 MED ORDER — IOHEXOL 300 MG/ML  SOLN
50.0000 mL | Freq: Once | INTRAMUSCULAR | Status: AC | PRN
  Administered 2020-11-30: 15 mL

## 2020-11-30 MED ORDER — CEFAZOLIN SODIUM-DEXTROSE 2-4 GM/100ML-% IV SOLN
INTRAVENOUS | Status: AC
  Filled 2020-11-30: qty 100

## 2020-11-30 MED ORDER — OXYCODONE HCL 5 MG PO TABS
5.0000 mg | ORAL_TABLET | ORAL | Status: DC | PRN
  Administered 2020-11-30 – 2020-12-01 (×2): 5 mg via ORAL
  Filled 2020-11-30 (×2): qty 1

## 2020-11-30 MED ORDER — SODIUM CHLORIDE 0.9 % IV BOLUS
1000.0000 mL | Freq: Once | INTRAVENOUS | Status: AC
  Administered 2020-11-30: 1000 mL via INTRAVENOUS

## 2020-11-30 MED ORDER — FENTANYL CITRATE (PF) 100 MCG/2ML IJ SOLN
INTRAMUSCULAR | Status: AC | PRN
  Administered 2020-11-30 (×2): 50 ug via INTRAVENOUS

## 2020-11-30 MED ORDER — ZOLPIDEM TARTRATE 5 MG PO TABS
5.0000 mg | ORAL_TABLET | Freq: Every evening | ORAL | Status: DC | PRN
Start: 2020-11-30 — End: 2020-12-01

## 2020-11-30 MED ORDER — DIPHENHYDRAMINE HCL 12.5 MG/5ML PO ELIX: 12.5000 mg | ORAL_SOLUTION | Freq: Four times a day (QID) | ORAL | Status: DC | PRN

## 2020-11-30 NOTE — Procedures (Signed)
Interventional Radiology Procedure Note  Procedure: Placement of 60F PCN vis lower pole calyx.   Complications: None  Estimated Blood Loss: None  Recommendations: - Tube to bag - Interval PCNL at the discretion of Urology   Signed,  Sterling Big, MD

## 2020-11-30 NOTE — ED Triage Notes (Signed)
Pt sts left sided flank pain for 3-4 days. Reports hx of kidney stones.

## 2020-11-30 NOTE — ED Provider Notes (Signed)
[facility]-EMERGENCY DEPT Provider Note   CSN: [id number] Arrival date & time: 11/30/20  0205     History Chief Complaint  Patient presents with  . Flank Pain    Ruth Stewart is a 40 y.o. female with a history of nephrolithiasis, cervical dysplasia, iron deficiency anemia who presents to the emergency department with a chief complaint of left flank pain for the last 3 to 4 days.  She reports that pain has been mild, aching, nonradiating, and constant.  However, she reports that she laid down on her right side last night and awoke with severe, constant, 10 out of 10, nonradiating, sharp, throbbing pain.  No known aggravating or alleviating factors.  She reports associated nausea, vomiting, and hematuria, which prompted her visit to the ER.  She denies fever, chills, vaginal bleeding or discharge, back pain, chest pain, shortness of breath, diarrhea, constipation.  No treatment at home prior to arrival.  The patient is followed by urology and has previously had a percutaneous nephrostomy tube placed by Dr. Marlou Porch.  The history is provided by the patient and medical records. A language interpreter was used ([place]).       Past Medical History:  Diagnosis Date  . Cervical dysplasia   . History of kidney stones   . Iron deficiency anemia    with pregnancy  . Medical history reviewed with no changes 04/09/2019    Patient Active Problem List   Diagnosis Date Noted  . Left renal stone 11/30/2020  . Nephrolithiasis 03/25/2019  . BMI 30s 04/04/2018  . 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 Language barrier 02/07/2018  . History of kidney stones 02/07/2018  . Advanced maternal age in multigravida 11/28/2017    Past Surgical History:  Procedure Laterality Date  . CYSTOSCOPY N/A 03/25/2019   Procedure: CYSTOSCOPY FLEXIBLE;  Surgeon: Crist Fat, MD;  Location: [facility];  Service: Urology;   Laterality: N/A;  . CYSTOSCOPY/URETEROSCOPY/HOLMIUM LASER/STENT PLACEMENT Right 04/11/2019   Procedure: CYSTOSCOPY RIGHT URETEROSCOPY WITH HOLMIUM LASER STONE EXTRACTION STENT EXCHANGE;  Surgeon: Crist Fat, MD;  Location: [facility];  Service: Urology;  Laterality: Right;  . LEEP  2011  . NEPHROLITHOTOMY Right 03/25/2019   Procedure: NEPHROLITHOTOMY PERCUTANEOUS WITH SURGEON ACCESS;  Surgeon: Crist Fat, MD;  Location: [facility];  Service: Urology;  Laterality: Right;     OB History    Gravida  7   Para  6   Term  5   Preterm  1   AB  1   Living  6     SAB  1   IAB      Ectopic      Multiple  0   Live Births  6           Family History  Problem Relation Age of Onset  . Hypertension Mother     Social History   Tobacco Use  . Smoking status: Never Smoker  . Smokeless tobacco: Never Used  Vaping Use  . Vaping Use: Never used  Substance Use Topics  . Alcohol use: Yes    Comment: sometimes  . Drug use: No    Home Medications Prior to Admission medications   Medication Sig Start Date End Date Taking? Authorizing Provider  acetaminophen (TYLENOL) 500 MG tablet Take 500 mg by mouth every 6 (six) hours as needed. Takes 2 tablets every 6 hours as needed Patient not taking: No sig reported  [provider]  ferrous sulfate (FERROUSUL) 325 (65 FE) MG tablet Take 1 tablet (325 mg total) by mouth 2 (two) times daily. Patient not taking: No sig reported 03/19/18   Conan Bowens, MD  ibuprofen (ADVIL,MOTRIN) 600 MG tablet Take 1 tablet (600 mg total) by mouth every 6 (six) hours. Patient not taking: No sig reported 05/12/18   Degele, Kandra Nicolas, MD  oxybutynin (DITROPAN) 5 MG tablet Take 1 tablet (5 mg total) by mouth 3 (three) times daily as needed for bladder spasms. Patient not taking: No sig reported 04/11/19   Crist Fat, MD  oxyCODONE-acetaminophen (PERCOCET) 5-325 MG tablet Take 1 tablet by mouth every 4 (four) hours as needed for up to  10 doses for severe pain. Patient not taking: No sig reported 04/11/19   Crist Fat, MD  Prenatal Multivit-Min-Fe-FA (PRENATAL VITAMINS) 0.8 MG tablet Take 1 tablet by mouth daily. Patient not taking: No sig reported 02/07/18   Conan Bowens, MD  tamsulosin [facility]) 0.4 MG CAPS capsule Take 1 capsule (0.4 mg total) by mouth daily after supper. Patient not taking: No sig reported 04/11/19   Crist Fat, MD    Allergies    Patient has no known allergies.  Review of Systems   Review of Systems  Constitutional: Negative for activity change, chills and fever.  HENT: Negative for congestion and sore throat.   Respiratory: Negative for shortness of breath and wheezing.   Cardiovascular: Negative for chest pain and palpitations.  Gastrointestinal: Positive for abdominal pain, nausea and vomiting. Negative for blood in stool, constipation, diarrhea and rectal pain.  Genitourinary: Positive for flank pain. Negative for dysuria, frequency, hematuria, menstrual problem, pelvic pain, urgency, vaginal bleeding, vaginal discharge and vaginal pain.  Musculoskeletal: Negative for back pain, gait problem, myalgias and neck pain.  Skin: Negative for rash and wound.  Allergic/Immunologic: Negative for immunocompromised state.  Neurological: Negative for seizures, syncope, weakness, numbness and headaches.  Psychiatric/Behavioral: Negative for confusion.    Physical Exam Updated Vital Signs BP 108/77   Pulse 85   Temp 98.4 F (36.9 C) (Oral)   Resp 18   Ht 5\' 2"  (1.575 m)   Wt 94.3 kg   SpO2 97%   BMI 38.04 kg/m   Physical Exam Vitals and nursing note reviewed.  Constitutional:      General: She is in acute distress.     Appearance: She is not ill-appearing, toxic-appearing or diaphoretic.     Comments: Writhing around on the bed in pain  HENT:     Head: Normocephalic.  Eyes:     Conjunctiva/sclera: Conjunctivae normal.  Cardiovascular:     Rate and Rhythm: Normal rate and  regular rhythm.     Heart sounds: No murmur heard. No friction rub. No gallop.   Pulmonary:     Effort: Pulmonary effort is normal. No respiratory distress.     Breath sounds: Normal breath sounds. No stridor. No wheezing, rhonchi or rales.  Chest:     Chest wall: No tenderness.  Abdominal:     General: There is no distension.     Palpations: Abdomen is soft.     Comments: Tender to palpation in the left lower quadrant.  No pelvic tenderness.  There is guarding, but no rebound.  No CVA tenderness bilaterally.  Normoactive bowel sounds. No pulsatile masses.   Musculoskeletal:     Cervical back: Neck supple.     Right lower leg: No edema.     Left  lower leg: No edema.  Skin:    General: Skin is warm.     Findings: No rash.  Neurological:     Mental Status: She is alert.  Psychiatric:        Behavior: Behavior normal.     ED Results / Procedures / Treatments   Labs (all labs ordered are listed, but only abnormal results are displayed) Labs Reviewed  URINALYSIS, ROUTINE W REFLEX MICROSCOPIC - Abnormal; Notable for the following components:      Result Value   APPearance HAZY (*)    Hgb urine dipstick LARGE (*)    Protein, ur 100 (*)    Leukocytes,Ua MODERATE (*)    All other components within normal limits  URINALYSIS, MICROSCOPIC (REFLEX) - Abnormal; Notable for the following components:   Bacteria, UA RARE (*)    All other components within normal limits  CBC WITH DIFFERENTIAL/PLATELET - Abnormal; Notable for the following components:   Hemoglobin 11.9 (*)    Monocytes Absolute 0.0 (*)    All other components within normal limits  COMPREHENSIVE METABOLIC PANEL - Abnormal; Notable for the following components:   Potassium 3.2 (*)    Glucose, Bld 144 (*)    BUN 21 (*)    Creatinine, Ser 1.02 (*)    Calcium 8.8 (*)    All other components within normal limits  RESP PANEL BY RT-PCR (FLU A&B, COVID) ARPGX2  LIPASE, BLOOD  LACTIC ACID, PLASMA  PREGNANCY, URINE  HIV  ANTIBODY (ROUTINE TESTING W REFLEX)    EKG None  Radiology CT Renal Stone Study  Addendum Date: 11/30/2020   ADDENDUM REPORT: 11/30/2020 06:45 ADDENDUM: Omitted finding of fatty umbilical hernia. Electronically Signed   By: Marnee Spring M.D.   On: 11/30/2020 06:45   Result Date: 11/30/2020 CLINICAL DATA:  Left flank pain for 1 day.  History of kidney stones EXAM: CT ABDOMEN AND PELVIS WITHOUT CONTRAST TECHNIQUE: Multidetector CT imaging of the abdomen and pelvis was performed following the standard protocol without IV contrast. COMPARISON:  03/26/2019 FINDINGS: Lower chest: Mild atelectasis at the bases, greatest in the right lower lobe. Hepatobiliary: Stable small low-density in the right liver, likely cyst.No evidence of biliary obstruction or stone. Pancreas: Unremarkable. Spleen: Unremarkable. Adrenals/Urinary Tract: Negative adrenals. Low-density left renal expansion with perinephric stranding and hydronephrosis related to a elongated stone at the UPJ which measures 3 cm in length and up to 1 cm in thickness. Superiorly there is the shape of a calyx cast. Branching left lower pole calculus measuring up to 2.6 cm in length, significantly progressed from 2020. Prior nephrostolithotomy on the right. Right lower pole calculus measuring 5 mm. Likely chronic right urothelial thickening superiorly. Unremarkable bladder. Stomach/Bowel: No obstruction. No visible bowel inflammation, including appendicitis. Vascular/Lymphatic: No acute vascular abnormality. No mass or adenopathy. Reproductive:No pathologic findings. Other: No ascites or pneumoperitoneum.  Fatty umbilical hernia Musculoskeletal: No acute abnormalities. IMPRESSION: 1. High-grade left urinary obstruction from a 3 x 1 cm stone at the UPJ. 2. Branching left lower pole calculus, significantly progressed from a 2020 CT. 3. 5 mm right renal calculus. Electronically Signed: By: Marnee Spring M.D. On: 11/30/2020 05:25    Procedures Procedures    Medications Ordered in ED Medications  0.45 % NaCl with KCl 20 mEq / L infusion (has no administration in time range)  acetaminophen (TYLENOL) tablet 650 mg (has no administration in time range)  oxyCODONE (Oxy IR/ROXICODONE) immediate release tablet 5 mg (has no administration in time range)  HYDROmorphone (DILAUDID)  injection 0.5-1 mg (has no administration in time range)  senna-docusate (Senokot-S) tablet 1 tablet (has no administration in time range)  bisacodyl (DULCOLAX) suppository 10 mg (has no administration in time range)  sodium phosphate (FLEET) 7-19 GM/118ML enema 1 enema (has no administration in time range)  ondansetron (ZOFRAN) injection 4 mg (has no administration in time range)  zolpidem (AMBIEN) tablet 5 mg (has no administration in time range)  diphenhydrAMINE (BENADRYL) injection 12.5-25 mg (has no administration in time range)    Or  diphenhydrAMINE (BENADRYL) 12.5 MG/5ML elixir 12.5-25 mg (has no administration in time range)  HYDROmorphone (DILAUDID) injection 0.5 mg (0.5 mg Intravenous Given 11/30/20 0319)  ondansetron (ZOFRAN) injection 4 mg (4 mg Intravenous Given 11/30/20 0320)  sodium chloride 0.9 % bolus 1,000 mL (0 mLs Intravenous Stopped 11/30/20 0556)  HYDROmorphone (DILAUDID) injection 0.5 mg (0.5 mg Intravenous Given 11/30/20 0526)  cefTRIAXone (ROCEPHIN) 1 g in sodium chloride 0.9 % 100 mL IVPB (0 g Intravenous Stopped 11/30/20 [id number])    ED Course  I have reviewed the triage vital signs and the nursing notes.  Pertinent labs & imaging results that were available during my care of the patient were reviewed by me and considered in my medical decision making (see chart for details).    MDM Rules/Calculators/A&P                          40 year old female with a history of nephrolithiasis, cervical dysplasia, iron deficiency anemia who presents the emergency department with a chief complaint of left flank and left lower quadrant abdominal pain that has been  present for the last 3 to 4 days, but significantly worsened tonight accompanied by nausea, vomiting, and hematuria.  Vital signs are stable.  On exam, patient is in acute distress and writhing around on the bed.  She is focally tender to palpation in the left lower quadrant.  Differential diagnosis includes bowel obstruction, mesenteric ischemia, diverticulitis, colitis, mesenteric ischemia, obstructive uropathy, UTI, pyelonephritis, appendicitis, ovarian torsion, spontaneous abortion, ectopic pregnancy.  Patient was discussed with Dr. Nicanor AlconPalumbo, attending physician.  Labs and imaging have been reviewed and independently interpreted by me.  Creatinine is 1.02, but this is elevated from previous.  She has mild hypokalemia.  No metabolic derangements.  Lactate is normal.  UA with large hemoglobinuria, pyuria, and WBC clumps.  COVID-19 panel is negative.  CT stone study with high-grade left urinary obstruction from a 3 x 1 cm stone at the UPJ.  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  There is also an incidental right renal calculus measuring 5 mm.  Patient has had multiple doses of pain medication in the ER, but pain has been difficult to control.  Consulted urology and spoke with Dr. Annabell HowellsWrenn.  Dr. Annabell HowellsWrenn recommends admission for pain control and arrange placement of a left percutaneous nephrostomy tube today.  Patient is agreeable with plan at this time.  The patient appears reasonably stabilized for admission considering the current resources, flow, and capabilities available in the ED at this time, and I doubt any other [facility] requiring further screening and/or treatment in the ED prior to admission.   Final Clinical Impression(s) / ED Diagnoses Final diagnoses:  Ureteral colic  Acute unilateral obstructive uropathy    Rx / DC Orders ED Discharge Orders    None       Khushbu Pippen,  Atalya Dano A, PA-C 11/30/20  0747    Palumbo, April, MD 11/30/20 2306

## 2020-11-30 NOTE — Consult Note (Signed)
Subjective: 1. Ureteral colic   2. Hydronephrosis of left kidney   3. Left nephrolithiasis     I was asked to see Ruth Stewart in consultation by Dr. Nicanor Alcon for a left staghorn stone with obstruction and poorly controlled pain.  She is a patient of Dr. Marlou Porch with a prior right PCNL.  She had the onset yesterday of severe pain with N/V and hematuria and was found on CT to have a left staghorn stone with a 3x1cm obstructing UPJ component and a 2.6cm LLP branched component.  Her pain has been only partially controlled with narcotics.  She has had no fever or voiding complaints and has no findings to suggest infection other than mild pyuria.    ROS:  Review of Systems  Constitutional: Negative for chills and fever.  Gastrointestinal: Positive for abdominal pain, nausea and vomiting.  Genitourinary: Positive for flank pain and hematuria.  All other systems reviewed and are negative.   No Known Allergies  Past Medical History:  Diagnosis Date  . Cervical dysplasia   . History of kidney stones   . Iron deficiency anemia    with pregnancy  . Medical history reviewed with no changes 04/09/2019    Past Surgical History:  Procedure Laterality Date  . CYSTOSCOPY N/A 03/25/2019   Procedure: CYSTOSCOPY FLEXIBLE;  Surgeon: Crist Fat, MD;  Location: [facility];  Service: Urology;  Laterality: N/A;  . CYSTOSCOPY/URETEROSCOPY/HOLMIUM LASER/STENT PLACEMENT Right 04/11/2019   Procedure: CYSTOSCOPY RIGHT URETEROSCOPY WITH HOLMIUM LASER STONE EXTRACTION STENT EXCHANGE;  Surgeon: Crist Fat, MD;  Location: [facility];  Service: Urology;  Laterality: Right;  . LEEP  2011  . NEPHROLITHOTOMY Right 03/25/2019   Procedure: NEPHROLITHOTOMY PERCUTANEOUS WITH SURGEON ACCESS;  Surgeon: Crist Fat, MD;  Location: [facility];  Service: Urology;  Laterality: Right;    Social History   Socioeconomic History  . Marital status: Single    Spouse name: Not on file  . Number of children: Not on file  .  Years of education: Not on file  . Highest education level: Not on file  Occupational History  . Not on file  Tobacco Use  . Smoking status: Never Smoker  . Smokeless tobacco: Never Used  Vaping Use  . Vaping Use: Never used  Substance and Sexual Activity  . Alcohol use: Yes    Comment: sometimes  . Drug use: No  . Sexual activity: Yes    Birth control/protection: Injection  Other Topics Concern  . Not on file  Social History Narrative  . Not on file   Social Determinants of Health   Financial Resource Strain: Not on file  Food Insecurity: Not on file  Transportation Needs: Not on file  Physical Activity: Not on file  Stress: Not on file  Social Connections: Not on file  Intimate Partner Violence: Not on file    Family History  Problem Relation Age of Onset  . Hypertension Mother     Anti-infectives: Anti-infectives (From admission, onward)   Start     Dose/Rate Route Frequency Ordered Stop   11/30/20 0645  cefTRIAXone (ROCEPHIN) 1 g in sodium chloride 0.9 % 100 mL IVPB  Status:  Discontinued        1 g 200 mL/hr over 30 Minutes Intravenous  Once 11/30/20 0639 11/30/20 0643   11/30/20 0615  cefTRIAXone (ROCEPHIN) 1 g in sodium chloride 0.9 % 100 mL IVPB        1 g 200 mL/hr over 30 Minutes Intravenous  Once  11/30/20 0601 11/30/20 0639      Current Facility-Administered Medications  Medication Dose Route Frequency Provider Last Rate Last Admin  . 0.45 % NaCl with KCl 20 mEq / L infusion   Intravenous Continuous Bjorn Pippin, MD      . acetaminophen (TYLENOL) tablet 650 mg  650 mg Oral Q4H PRN Bjorn Pippin, MD      . bisacodyl (DULCOLAX) suppository 10 mg  10 mg Rectal Daily PRN Bjorn Pippin, MD      . diphenhydrAMINE (BENADRYL) injection 12.5-25 mg  12.5-25 mg Intravenous Q6H PRN Bjorn Pippin, MD       Or  . diphenhydrAMINE (BENADRYL) 12.5 MG/5ML elixir 12.5-25 mg  12.5-25 mg Oral Q6H PRN Bjorn Pippin, MD      . HYDROmorphone (DILAUDID) injection 0.5-1 mg  0.5-1 mg  Intravenous Q2H PRN Bjorn Pippin, MD      . ondansetron [facility]) injection 4 mg  4 mg Intravenous Q4H PRN Bjorn Pippin, MD      . oxyCODONE (Oxy IR/ROXICODONE) immediate release tablet 5 mg  5 mg Oral Q4H PRN Bjorn Pippin, MD      . senna-docusate (Senokot-S) tablet 1 tablet  1 tablet Oral QHS PRN Bjorn Pippin, MD      . sodium phosphate (FLEET) 7-19 GM/118ML enema 1 enema  1 enema Rectal Once PRN Bjorn Pippin, MD      . zolpidem (AMBIEN) tablet 5 mg  5 mg Oral QHS PRN Bjorn Pippin, MD       Current Outpatient Medications  Medication Sig Dispense Refill  . acetaminophen (TYLENOL) 500 MG tablet Take 500 mg by mouth every 6 (six) hours as needed. Takes 2 tablets every 6 hours as needed (Patient not taking: No sig reported)    . ferrous sulfate (FERROUSUL) 325 (65 FE) MG tablet Take 1 tablet (325 mg total) by mouth 2 (two) times daily. (Patient not taking: No sig reported) 60 tablet 1  . ibuprofen (ADVIL,MOTRIN) 600 MG tablet Take 1 tablet (600 mg total) by mouth every 6 (six) hours. (Patient not taking: No sig reported) 30 tablet 0  . oxybutynin (DITROPAN) 5 MG tablet Take 1 tablet (5 mg total) by mouth 3 (three) times daily as needed for bladder spasms. (Patient not taking: No sig reported) 30 tablet 0  . oxyCODONE-acetaminophen (PERCOCET) 5-325 MG tablet Take 1 tablet by mouth every 4 (four) hours as needed for up to 10 doses for severe pain. (Patient not taking: No sig reported) 10 tablet 0  . Prenatal Multivit-Min-Fe-FA (PRENATAL VITAMINS) 0.8 MG tablet Take 1 tablet by mouth daily. (Patient not taking: No sig reported) 30 tablet 12  . tamsulosin (FLOMAX) 0.4 MG CAPS capsule Take 1 capsule (0.4 mg total) by mouth daily after supper. (Patient not taking: No sig reported) 14 capsule 0     Objective: Vital signs in last 24 hours: BP 108/77   Pulse 85   Temp 98.4 F (36.9 C) (Oral)   Resp 18   Ht 5\' 2"  (1.575 m)   Wt 94.3 kg   SpO2 97%   BMI 38.04 kg/m   Intake/Output from previous  day: 02/13 0701 - 02/14 0700 In: 1100 [IV Piggyback:1100] Out: -  Intake/Output this shift: No intake/output data recorded.   Physical Exam Vitals reviewed.  Constitutional:      Appearance: Normal appearance.  HENT:     Head: Normocephalic and atraumatic.  Cardiovascular:     Rate and Rhythm: Normal rate and regular rhythm.  Heart sounds: Normal heart sounds.  Pulmonary:     Effort: Pulmonary effort is normal. No respiratory distress.     Breath sounds: Normal breath sounds.  Abdominal:     General: Abdomen is flat.     Palpations: Abdomen is soft.     Tenderness: There is abdominal tenderness (LUQ ). There is left CVA tenderness.  Musculoskeletal:        General: No swelling or tenderness. Normal range of motion.     Cervical back: Normal range of motion and neck supple.  Skin:    General: Skin is warm and dry.  Neurological:     General: No focal deficit present.     Mental Status: She is alert and oriented to person, place, and time.  Psychiatric:        Mood and Affect: Mood normal.        Behavior: Behavior normal.     Lab Results:  Results for orders placed or performed during the hospital encounter of 11/30/20 (from the past 24 hour(s))  Urinalysis, Routine w reflex microscopic Urine, Clean Catch     Status: Abnormal   Collection Time: 11/30/20  2:22 AM  Result Value Ref Range   Color, Urine YELLOW YELLOW   APPearance HAZY (A) CLEAR   Specific Gravity, Urine 1.025 1.005 - 1.030   pH 6.5 5.0 - 8.0   Glucose, UA NEGATIVE NEGATIVE mg/dL   Hgb urine dipstick LARGE (A) NEGATIVE   Bilirubin Urine NEGATIVE NEGATIVE   Ketones, ur NEGATIVE NEGATIVE mg/dL   Protein, ur 478 (A) NEGATIVE mg/dL   Nitrite NEGATIVE NEGATIVE   Leukocytes,Ua MODERATE (A) NEGATIVE  Urinalysis, Microscopic (reflex)     Status: Abnormal   Collection Time: 11/30/20  2:22 AM  Result Value Ref Range   RBC / HPF 6-10 0 - 5 RBC/hpf   WBC, UA 11-20 0 - 5 WBC/hpf   Bacteria, UA RARE (A)  NONE SEEN   Squamous Epithelial / LPF 0-5 0 - 5   WBC Clumps PRESENT   Pregnancy, urine     Status: None   Collection Time: 11/30/20  2:22 AM  Result Value Ref Range   Preg Test, Ur NEGATIVE NEGATIVE  CBC with Differential     Status: Abnormal   Collection Time: 11/30/20  3:21 AM  Result Value Ref Range   WBC 5.9 4.0 - 10.5 K/uL   RBC 4.21 3.87 - 5.11 MIL/uL   Hemoglobin 11.9 (L) 12.0 - 15.0 g/dL   HCT 29.5 62.1 - 30.8 %   MCV 90.5 80.0 - 100.0 fL   MCH 28.3 26.0 - 34.0 pg   MCHC 31.2 30.0 - 36.0 g/dL   RDW 65.7 84.6 - 96.2 %   Platelets 197 150 - 400 K/uL   nRBC 0.0 0.0 - 0.2 %   Neutrophils Relative % 78 %   Neutro Abs 4.6 1.7 - 7.7 K/uL   Lymphocytes Relative 21 %   Lymphs Abs 1.2 0.7 - 4.0 K/uL   Monocytes Relative 1 %   Monocytes Absolute 0.0 (L) 0.1 - 1.0 K/uL   Eosinophils Relative 0 %   Eosinophils Absolute 0.0 0.0 - 0.5 K/uL   Basophils Relative 0 %   Basophils Absolute 0.0 0.0 - 0.1 K/uL   Immature Granulocytes 0 %   Abs Immature Granulocytes 0.02 0.00 - 0.07 K/uL  Comprehensive metabolic panel     Status: Abnormal   Collection Time: 11/30/20  3:21 AM  Result Value Ref Range  Sodium 142 135 - 145 mmol/L   Potassium 3.2 (L) 3.5 - 5.1 mmol/L   Chloride 107 98 - 111 mmol/L   CO2 23 22 - 32 mmol/L   Glucose, Bld 144 (H) 70 - 99 mg/dL   BUN 21 (H) 6 - 20 mg/dL   Creatinine, Ser 2.69 (H) 0.44 - 1.00 mg/dL   Calcium 8.8 (L) 8.9 - 10.3 mg/dL   Total Protein 6.9 6.5 - 8.1 g/dL   Albumin 3.7 3.5 - 5.0 g/dL   AST 17 15 - 41 U/L   ALT 18 0 - 44 U/L   Alkaline Phosphatase 84 38 - 126 U/L   Total Bilirubin 0.8 0.3 - 1.2 mg/dL   GFR, Estimated >48 >54 mL/min   Anion gap 12 5 - 15  Lipase, blood     Status: None   Collection Time: 11/30/20  3:21 AM  Result Value Ref Range   Lipase 26 11 - 51 U/L  Lactic acid, plasma     Status: None   Collection Time: 11/30/20  3:21 AM  Result Value Ref Range   Lactic Acid, Venous 1.8 0.5 - 1.9 mmol/L  Resp Panel by RT-PCR (Flu  A&B, Covid) Nasopharyngeal Swab     Status: None   Collection Time: 11/30/20  5:50 AM   Specimen: Nasopharyngeal Swab; Nasopharyngeal(NP) swabs in vial transport medium  Result Value Ref Range   SARS Coronavirus 2 by RT PCR NEGATIVE NEGATIVE   Influenza A by PCR NEGATIVE NEGATIVE   Influenza B by PCR NEGATIVE NEGATIVE    BMET Recent Labs    11/30/20 0321  NA 142  K 3.2*  CL 107  CO2 23  GLUCOSE 144*  BUN 21*  CREATININE 1.02*  CALCIUM 8.8*   PT/INR No results for input(s): LABPROT, INR in the last 72 hours. ABG No results for input(s): PHART, HCO3 in the last 72 hours.  Invalid input(s): PCO2, PO2  Studies/Results: CT Renal Stone Study  Addendum Date: 11/30/2020   ADDENDUM REPORT: 11/30/2020 06:45 ADDENDUM: Omitted finding of fatty umbilical hernia. Electronically Signed   By: Marnee Spring M.D.   On: 11/30/2020 06:45   Result Date: 11/30/2020 CLINICAL DATA:  Left flank pain for 1 day.  History of kidney stones EXAM: CT ABDOMEN AND PELVIS WITHOUT CONTRAST TECHNIQUE: Multidetector CT imaging of the abdomen and pelvis was performed following the standard protocol without IV contrast. COMPARISON:  03/26/2019 FINDINGS: Lower chest: Mild atelectasis at the bases, greatest in the right lower lobe. Hepatobiliary: Stable small low-density in the right liver, likely cyst.No evidence of biliary obstruction or stone. Pancreas: Unremarkable. Spleen: Unremarkable. Adrenals/Urinary Tract: Negative adrenals. Low-density left renal expansion with perinephric stranding and hydronephrosis related to a elongated stone at the UPJ which measures 3 cm in length and up to 1 cm in thickness. Superiorly there is the shape of a calyx cast. Branching left lower pole calculus measuring up to 2.6 cm in length, significantly progressed from 2020. Prior nephrostolithotomy on the right. Right lower pole calculus measuring 5 mm. Likely chronic right urothelial thickening superiorly. Unremarkable bladder.  Stomach/Bowel: No obstruction. No visible bowel inflammation, including appendicitis. Vascular/Lymphatic: No acute vascular abnormality. No mass or adenopathy. Reproductive:No pathologic findings. Other: No ascites or pneumoperitoneum.  Fatty umbilical hernia Musculoskeletal: No acute abnormalities. IMPRESSION: 1. High-grade left urinary obstruction from a 3 x 1 cm stone at the UPJ. 2. Branching left lower pole calculus, significantly progressed from a 2020 CT. 3. 5 mm right renal calculus. Electronically Signed: By:  Marnee SpringJonathon  Watts M.D. On: 11/30/2020 05:25   I have discussed her case with Dr. Nicanor AlconPalumbo and reviewed the pertinent labs, imaging and notes.    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   I will admit her for pain control and arrange placement of a left percutaneous nephrostomy tube today.   She will need PCNL in the future.          CC: Dr. April Palumbo.     Bjorn PippinJohn Bowe Sidor 11/30/2020 [phone]

## 2020-11-30 NOTE — H&P (Addendum)
Chief Complaint: Patient was seen in consultation today for  image guided LEFT percutaneous nephrostomy tube placement Chief Complaint  Patient presents with   Flank Pain   at the request of Dr. Annabell Howells, She[facility] Commons.  Referring Physician(s): Dr. Annabell Howells, J.  Supervising Physician: Malachy Moan  Patient Status: [facility] - ED  History of Present Illness: Ruth Stewart is a 40 y.o. female past medical history of nephrolithiasis, cervical dysplasia, iron deficiency anemia who presented to [facility] emergency department with a chief complaint of left flank pain for last 3 to 4 days. Labs and urinalysis were remarkable with elevate BUN and creatinine, large hemoglobinuria, and pyuria.  CT renal stone study showed high-grade left urinary obstruction from a 3 x 1 cm stone at the UPJ, and another 2.6 cm stone at LLP.   IR was requested by Dr. Annabell Howells for image guided LEFT percutaneous nephrostomy tube placement.  Patient laying in bed, reports mild left flank pain.  Patient denies fever, chills, shortness of breath, chest pain, cough, nausea, vomiting, and bleeding.   Past Medical History:  Diagnosis Date   Cervical dysplasia    History of kidney stones    Iron deficiency anemia    with pregnancy   Medical history reviewed with no changes 04/09/2019    Past Surgical History:  Procedure Laterality Date   CYSTOSCOPY N/A 03/25/2019   Procedure: CYSTOSCOPY FLEXIBLE;  Surgeon: Crist Fat, MD;  Location: [facility];  Service: Urology;  Laterality: N/A;   CYSTOSCOPY/URETEROSCOPY/HOLMIUM LASER/STENT PLACEMENT Right 04/11/2019   Procedure: CYSTOSCOPY RIGHT URETEROSCOPY WITH HOLMIUM LASER STONE EXTRACTION STENT EXCHANGE;  Surgeon: Crist Fat, MD;  Location: [facility];  Service: Urology;  Laterality: Right;   LEEP  2011   NEPHROLITHOTOMY Right 03/25/2019   Procedure: NEPHROLITHOTOMY PERCUTANEOUS WITH SURGEON ACCESS;  Surgeon: Crist Fat, MD;  Location: [facility];  Service:  Urology;  Laterality: Right;    Allergies: Patient has no known allergies.  Medications: Prior to Admission medications   Medication Sig Start Date End Date Taking? Authorizing Provider  acetaminophen (TYLENOL) 500 MG tablet Take 500 mg by mouth every 6 (six) hours as needed. Takes 2 tablets every 6 hours as needed Patient not taking: No sig reported    [provider]  ferrous sulfate (FERROUSUL) 325 (65 FE) MG tablet Take 1 tablet (325 mg total) by mouth 2 (two) times daily. Patient not taking: No sig reported 03/19/18   Conan Bowens, MD  ibuprofen (ADVIL,MOTRIN) 600 MG tablet Take 1 tablet (600 mg total) by mouth every 6 (six) hours. Patient not taking: No sig reported 05/12/18   Degele, Kandra Nicolas, MD  oxybutynin (DITROPAN) 5 MG tablet Take 1 tablet (5 mg total) by mouth 3 (three) times daily as needed for bladder spasms. Patient not taking: No sig reported 04/11/19   Crist Fat, MD  oxyCODONE-acetaminophen (PERCOCET) 5-325 MG tablet Take 1 tablet by mouth every 4 (four) hours as needed for up to 10 doses for severe pain. Patient not taking: No sig reported 04/11/19   Crist Fat, MD  Prenatal Multivit-Min-Fe-FA (PRENATAL VITAMINS) 0.8 MG tablet Take 1 tablet by mouth daily. Patient not taking: No sig reported 02/07/18   Conan Bowens, MD  tamsulosin [facility]) 0.4 MG CAPS capsule Take 1 capsule (0.4 mg total) by mouth daily after supper. Patient not taking: No sig reported 04/11/19   Crist Fat, MD     Family History  Problem Relation Age of Onset   Hypertension  Mother     Social History   Socioeconomic History   Marital status: Single    Spouse name: Not on file   Number of children: Not on file   Years of education: Not on file   Highest education level: Not on file  Occupational History   Not on file  Tobacco Use   Smoking status: Never Smoker   Smokeless tobacco: Never Used  Vaping Use   Vaping Use: Never used  Substance and  Sexual Activity   Alcohol use: Yes    Comment: sometimes   Drug use: No   Sexual activity: Yes    Birth control/protection: Injection  Other Topics Concern   Not on file  Social History Narrative   Not on file   Social Determinants of Health   Financial Resource Strain: Not on file  Food Insecurity: Not on file  Transportation Needs: Not on file  Physical Activity: Not on file  Stress: Not on file  Social Connections: Not on file     Review of Systems: A 12 point ROS discussed and pertinent positives are indicated in the HPI above.  All other systems are negative.   Vital Signs: BP 108/77    Pulse 85    Temp 98.4 F (36.9 C) (Oral)    Resp 18    Ht 5\' 2"  (1.575 m)    Wt 208 lb (94.3 kg)    SpO2 97%    BMI 38.04 kg/m   Physical Exam Vitals reviewed.  Cardiovascular:     Rate and Rhythm: Normal rate and regular rhythm.     Pulses: Normal pulses.     Heart sounds: Normal heart sounds.  Pulmonary:     Effort: Pulmonary effort is normal.     Breath sounds: Normal breath sounds.  Abdominal:     General: Bowel sounds are normal.     Palpations: Abdomen is soft.     Tenderness: There is left CVA tenderness.  Neurological:     Mental Status: She is alert and oriented to person, place, and time.  Psychiatric:        Mood and Affect: Mood normal.        Behavior: Behavior normal.        Thought Content: Thought content normal.        Judgment: Judgment normal.     MD Evaluation Airway: WNL Heart: WNL Abdomen: WNL Chest/ Lungs: WNL ASA  Classification: 2 Mallampati/Airway Score: One  Imaging: CT Renal Stone Study  Addendum Date: 11/30/2020   ADDENDUM REPORT: 11/30/2020 06:45 ADDENDUM: Omitted finding of fatty umbilical hernia. Electronically Signed   By: 12/02/2020 M.D.   On: 11/30/2020 06:45   Result Date: 11/30/2020 CLINICAL DATA:  Left flank pain for 1 day.  History of kidney stones EXAM: CT ABDOMEN AND PELVIS WITHOUT CONTRAST TECHNIQUE:  Multidetector CT imaging of the abdomen and pelvis was performed following the standard protocol without IV contrast. COMPARISON:  03/26/2019 FINDINGS: Lower chest: Mild atelectasis at the bases, greatest in the right lower lobe. Hepatobiliary: Stable small low-density in the right liver, likely cyst.No evidence of biliary obstruction or stone. Pancreas: Unremarkable. Spleen: Unremarkable. Adrenals/Urinary Tract: Negative adrenals. Low-density left renal expansion with perinephric stranding and hydronephrosis related to a elongated stone at the UPJ which measures 3 cm in length and up to 1 cm in thickness. Superiorly there is the shape of a calyx cast. Branching left lower pole calculus measuring up to 2.6 cm in length, significantly progressed from  2020. Prior nephrostolithotomy on the right. Right lower pole calculus measuring 5 mm. Likely chronic right urothelial thickening superiorly. Unremarkable bladder. Stomach/Bowel: No obstruction. No visible bowel inflammation, including appendicitis. Vascular/Lymphatic: No acute vascular abnormality. No mass or adenopathy. Reproductive:No pathologic findings. Other: No ascites or pneumoperitoneum.  Fatty umbilical hernia Musculoskeletal: No acute abnormalities. IMPRESSION: 1. High-grade left urinary obstruction from a 3 x 1 cm stone at the UPJ. 2. Branching left lower pole calculus, significantly progressed from a 2020 CT. 3. 5 mm right renal calculus. Electronically Signed: By: Marnee Spring M.D. On: 11/30/2020 05:25    Labs:  CBC: Recent Labs    11/30/20 0321  WBC 5.9  HGB 11.9*  HCT 38.1  PLT 197    COAGS: No results for input(s): INR, APTT in the last 8760 hours.  BMP: Recent Labs    11/30/20 0321  NA 142  K 3.2*  CL 107  CO2 23  GLUCOSE 144*  BUN 21*  CALCIUM 8.8*  CREATININE 1.02*  GFRNONAA >60    LIVER FUNCTION TESTS: Recent Labs    11/30/20 0321  BILITOT 0.8  AST 17  ALT 18  ALKPHOS 84  PROT 6.9  ALBUMIN 3.7    TUMOR  MARKERS: No results for input(s): AFPTM, CEA, CA199, CHROMGRNA in the last 8760 hours.  Assessment and Plan: 40 y.o. female with high-grade left urinary obstruction from a 3 x 1 cm stone at the UPJ.  Image and labs were reviewed but Dr. Archer Asa and the procedure was approved.  Patient is scheduled for image guided LEFT percutaneous nephrostomy tube placement with IR today. NPO since last night, NPO order in. INR pending Preg test negative COVID negative   Risks and benefits of left PCN placement was discussed with the patient including, but not limited to, infection, bleeding, significant bleeding causing loss or decrease in renal function or damage to adjacent structures.   All of the patient's questions were answered, patient is agreeable to proceed.  Consent signed and in IR department.    Thank you for this interesting consult.  I greatly enjoyed meeting Ruth Stewart and look forward to participating in their care.  A copy of this report was sent to the requesting provider on this date.  Electronically Signed: Willette Brace, PA-C 11/30/2020, 9:08 AM   I spent a total of  30 Minutes  in face to face in clinical consultation, greater than 50% of which was counseling/coordinating care for  image guided LEFT percutaneous nephrostomy tube placement

## 2020-12-01 ENCOUNTER — Other Ambulatory Visit: Payer: Self-pay | Admitting: Urology

## 2020-12-01 ENCOUNTER — Encounter: Payer: Self-pay | Admitting: Urology

## 2020-12-01 MED ORDER — ONDANSETRON HCL 4 MG PO TABS: 4.0000 mg | ORAL_TABLET | Freq: Three times a day (TID) | ORAL | 0 refills | Status: DC | PRN

## 2020-12-01 MED ORDER — TRAMADOL HCL 50 MG PO TABS: 50.0000 mg | ORAL_TABLET | Freq: Four times a day (QID) | ORAL | 0 refills | Status: DC | PRN

## 2020-12-01 MED ORDER — ACETAMINOPHEN 500 MG PO TABS: 1000.0000 mg | ORAL_TABLET | Freq: Four times a day (QID) | ORAL | 0 refills | Status: DC | PRN

## 2020-12-01 NOTE — Discharge Instructions (Signed)
Discharge instructions following PCNL  Call your doctor for: Fevers greater than 100.5 Severe nausea or vomiting Increasing pain not controlled by pain medication Increasing redness or drainage from incisions Decreased urine output or a catheter is no longer draining  The number for questions is [phone].  Activity: Gradually increase activity with short frequent walks, 3-4 times a day.  Avoid strenuous activities, like sports, lawn-mowing, or heavy lifting (more than 10-15 pounds).  Wear loose, comfortable clothing that pull or kink the tube or tubes.  Do not drive while taking pain medication, or until your doctor permitts it.  Bathing and dressing changes: Do not soak your back in a bathtub.  Drainage bag care: You may be discharged with a drainage bag around the site of your surgery.  The drainage bag should be secured such that it never pulls or loosens to prevent it from leaking.  It is important to wash her hands before and after emptying the drainage bag to help prevent the spread of infection.  The drainage bag should be emptied as needed.  When the wound stops draining or it is manageable with a dry gauze dressing, you can remove the bag.  Diet: It is extremely important to drink plenty of fluids after surgery, especially water.  You may resume your regular diet, unless otherwise instructed.  Medications: May take Tylenol (acetaminophen) or ibuprofen (Advil, Motrin) as directed over-the-counter. Take any prescriptions as directed.  Follow-up appointments: Follow-up appointment will be scheduled with Dr. Marlou Porch in 10-14 days for hospital check.

## 2020-12-01 NOTE — Discharge Summary (Signed)
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 Left renal stone 11/30/2020  . Nephrolithiasis 03/25/2019  . BMI 30s 04/04/2018  . 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 Language barrier 02/07/2018  . History of kidney stones 02/07/2018  . Advanced maternal age in multigravida 11/28/2017    Procedures performed:   History and Physical: For full details, please see admission history and physical. Briefly, Ruth Stewart is a 40 y.o. year old patient with renal colic from obstructing left ureteral stone.   Hospital Course: Patient tolerated the procedure well.  She was then transferred to the floor after an uneventful recovery in IR.  Her hospital course was uncomplicated.  On POD#1 she had met discharge criteria: was eating a regular diet, was up and ambulating independently,  pain was well controlled, was voiding without a catheter, and was ready to for discharge.  EXAM at discharge: NAD Today's Vitals   11/30/20 2133 12/01/20 0251 12/01/20 0540 12/01/20 0717  BP: (!) 140/91  102/72   Pulse: 96  94   Resp: 19  18   Temp: 99.4 F (37.4 C)  98.4 F (36.9 C)   TempSrc: Oral  Oral   SpO2: 93%  97%   Weight:      Height:      PainSc:  7   3    Body mass index is 38.04 kg/m.  Intake/Output Summary (Last 24 hours) at 12/01/2020 0724 Last data filed at 12/01/2020 0600 Gross per 24 hour  Intake 2580.67 ml  Output 2900 ml  Net -319.33 ml   Non-labored breathing Neph tube draining straw colored urine, puncture site clean. Abdomen soft Ext symmetric  Laboratory values:  Recent Labs    11/30/20 0321  WBC 5.9  HGB 11.9*  HCT 38.1   Recent Labs    11/30/20 0321  NA 142  K 3.2*  CL 107  CO2 23  GLUCOSE 144*   BUN 21*  CREATININE 1.02*  CALCIUM 8.8*   Recent Labs    11/30/20 0926  INR 1.1   No results for input(s): LABURIN in the last 72 hours. Results for orders placed or performed during the hospital encounter of 11/30/20  Resp Panel by RT-PCR (Flu A&B, Covid) Nasopharyngeal Swab     Status: None   Collection Time: 11/30/20  5:50 AM   Specimen: Nasopharyngeal Swab; Nasopharyngeal(NP) swabs in vial transport medium  Result Value Ref Range Status   SARS Coronavirus 2 by RT PCR NEGATIVE NEGATIVE Final    Comment: (NOTE) SARS-CoV-2 target nucleic acids are NOT DETECTED.  The SARS-CoV-2 RNA is generally detectable in upper respiratory specimens during the acute phase of infection. The lowest concentration of SARS-CoV-2 viral copies this assay can detect is 138 copies/mL. A negative result does not preclude SARS-Cov-2 infection and should not be used as the sole basis for treatment or other patient management decisions. A negative result may occur with  improper specimen collection/handling, submission of specimen other than nasopharyngeal swab, presence of viral mutation(s) within the areas targeted by this assay, and inadequate number of viral copies(<138 copies/mL). A negative result must be combined with clinical observations, patient history, and epidemiological information. The expected result is Negative.  Fact Sheet for Patients:  EntrepreneurPulse.com.au  Fact Sheet for Healthcare Providers:  IncredibleEmployment.be  This test is no t yet approved or cleared by the [place] and  has been authorized for detection and/or diagnosis of SARS-CoV-2 by FDA under an Emergency Use Authorization (EUA). This EUA will remain  in effect (meaning this test can be used) for the duration of the COVID-19 declaration under Section 564(b)(1) of the Act, 21 U.S.C.section 360bbb-3(b)(1), unless the authorization is terminated  or revoked sooner.        Influenza A by PCR NEGATIVE NEGATIVE Final   Influenza B by PCR NEGATIVE NEGATIVE Final    Comment: (NOTE) The Xpert Xpress SARS-CoV-2/FLU/RSV plus assay is intended as an aid in the diagnosis of influenza from Nasopharyngeal swab specimens and should not be used as a sole basis for treatment. Nasal washings and aspirates are unacceptable for Xpert Xpress SARS-CoV-2/FLU/RSV testing.  Fact Sheet for Patients: EntrepreneurPulse.com.au  Fact Sheet for Healthcare Providers: IncredibleEmployment.be  This test is not yet approved or cleared by the [place] FDA and has been authorized for detection and/or diagnosis of SARS-CoV-2 by FDA under an Emergency Use Authorization (EUA). This EUA will remain in effect (meaning this test can be used) for the duration of the COVID-19 declaration under Section 564(b)(1) of the Act, 21 U.S.C. section 360bbb-3(b)(1), unless the authorization is terminated or revoked.  Performed at [facility], [place] [address], [place], [place] [address]     Disposition: Home  Discharge instruction: The patient was instructed to be ambulatory but told to refrain from heavy lifting, strenuous activity, or driving.   Discharge medications:  Allergies as of 12/01/2020   No Known Allergies     Medication List    STOP taking these medications   ferrous sulfate 325 (65 FE) MG tablet Commonly known as: FerrouSul   oxybutynin 5 MG tablet Commonly known as: DITROPAN   oxyCODONE-acetaminophen 5-325 MG tablet Commonly known as: Percocet   Prenatal Vitamins 0.8 MG tablet   tamsulosin 0.4 MG Caps capsule Commonly known as: FLOMAX     TAKE these medications   acetaminophen 500 MG tablet Commonly known as: TYLENOL Take 2 tablets (1,000 mg total) by mouth every 6 (six) hours as needed for moderate pain. Takes 2 tablets every 6 hours as needed What changed:   how much to take  reasons to take  this   ibuprofen 600 MG tablet Commonly known as: ADVIL Take 1 tablet (600 mg total) by mouth every 6 (six) hours.   ondansetron 4 MG tablet Commonly known as: Zofran Take 1 tablet (4 mg total) by mouth every 8 (eight) hours as needed for nausea or vomiting.   traMADol 50 MG tablet Commonly known as: Ultram Take 1-2 tablets (50-100 mg total) by mouth every 6 (six) hours as needed for moderate pain.       Followup:   Follow-up Information    [facility] Follow up in 2 week(s).   Contact information: [place] [phone]

## 2020-12-01 NOTE — Plan of Care (Signed)

## 2020-12-11 NOTE — Progress Notes (Addendum)
COVID Vaccine Completed:  No Date COVID Vaccine completed: Has received booster: COVID vaccine manufacturer: Pfizer    [facility] [employer] [employer]   Date of COVID positive in last 90 days:  N/A  PCP - Health Department Cardiologist - N/A  Nexplanon L arm  Chest x-ray - N/A EKG - N/A Stress Test -  ECHO -  Cardiac Cath -  Pacemaker/ICD device last checked:  Sleep Study - N/A CPAP -   Fasting Blood Sugar - N/A Checks Blood Sugar _____ times a day  Blood Thinner Instructions:N/A Aspirin Instructions: Last Dose:  Activity level:  Can go up a flight of stairs and perform activities of daily living without stopping and without symptoms.     Anesthesia review: N/A   Patient denies shortness of breath, fever, cough and chest pain at PAT appointment   Patient verbalized understanding of instructions that were given to them at the PAT appointment. Patient was also instructed that they will need to review over the PAT instructions again at home before surgery.

## 2020-12-11 NOTE — Patient Instructions (Addendum)
DUE TO COVID-19 ONLY ONE VISITOR IS ALLOWED TO COME WITH YOU AND STAY IN THE WAITING ROOM ONLY DURING PRE OP AND PROCEDURE.   IF YOU WILL BE ADMITTED INTO THE HOSPITAL YOU ARE ALLOWED ONLY TWO SUPPORT PEOPLE DURING VISITATION HOURS ONLY (10AM -8PM)   . The support person(s) may change daily. . The support person(s) must pass our screening, gel in and out, and wear a mask at all times, including in the patient's room. . Patients must also wear a mask when staff or their support person are in the room.  No visitors under the age of 93. Any visitor under the age of 85 must be accompanied by an adult.    COVID SWAB TESTING MUST BE COMPLETED ON:  Monday, 12-21-20 @ 11:05 AM   [address] [place], [place] [address]  (Must self quarantine after testing. Follow instructions on handout.)        Your procedure is scheduled on:   Thursday, 12-24-20   Report to [facility] Main  Entrance   Report to Short Stay at 5:30 AM   [facility])    Call this number if you have problems the morning of surgery [phone]   Do not eat food :After Midnight.   May have liquids until 4:15 AM  day of surgery  CLEAR LIQUID DIET  Foods Allowed                                                                     Foods Excluded  Water, Black Coffee and tea, regular and decaf              liquids that you cannot  Plain Jell-O in any flavor  (No red)                                     see through such as: Fruit ices (not with fruit pulp)                                      milk, soups, orange juice              Iced Popsicles (No red)                                      All solid food                                   Apple juices Sports drinks like Gatorade (No red) Lightly seasoned clear broth or consume(fat free) Sugar, honey syrup      Oral Hygiene is also important to reduce your risk of infection.                                    Remember - BRUSH YOUR TEETH THE MORNING OF SURGERY WITH  YOUR REGULAR TOOTHPASTE  Do NOT smoke after Midnight   Take these medicines the morning of surgery with A SIP OF WATER: Bactrim DM                              You may not have any metal on your body including hair pins, jewelry, and body piercings             Do not wear make-up, lotions, powders, perfumes/cologne, or deodorant             Do not wear nail polish.  Do not shave  48 hours prior to surgery.              Do not bring valuables to the hospital. [facility] IS NOT             RESPONSIBLE   FOR VALUABLES.   Contacts, dentures or bridgework may not be worn into surgery.   Bring small overnight bag day of surgery.                Please read over the following fact sheets you were given: IF YOU HAVE QUESTIONS ABOUT YOUR PRE OP INSTRUCTIONS PLEASE CALL  [phone] [facility] - Preparing for Surgery Before surgery, you can play an important role.  Because skin is not sterile, your skin needs to be as free of germs as possible.  You can reduce the number of germs on your skin by washing with CHG (chlorahexidine gluconate) soap before surgery.  CHG is an antiseptic cleaner which kills germs and bonds with the skin to continue killing germs even after washing. Please DO NOT use if you have an allergy to CHG or antibacterial soaps.  If your skin becomes reddened/irritated stop using the CHG and inform your nurse when you arrive at Short Stay. Do not shave (including legs and underarms) for at least 48 hours prior to the first CHG shower.  You may shave your face/neck.  Please follow these instructions carefully:  1.  Shower with CHG Soap the night before surgery and the  morning of surgery.  2.  If you choose to wash your hair, wash your hair first as usual with your normal  shampoo.  3.  After you shampoo, rinse your hair and body thoroughly to remove the shampoo.                             4.  Use CHG as you would any other liquid soap.  You can apply chg directly to  the skin and wash.  Gently with a scrungie or clean washcloth.  5.  Apply the CHG Soap to your body ONLY FROM THE NECK DOWN.   Do   not use on face/ open                           Wound or open sores. Avoid contact with eyes, ears mouth and   genitals (private parts).                       Wash face,  Genitals (private parts) with your normal soap.             6.  Wash thoroughly, paying special attention to the area where your    surgery  will be performed.  7.  Thoroughly rinse your body with warm water from the neck down.  8.  DO NOT shower/wash with your normal soap after using and rinsing off the CHG Soap.                9.  Pat yourself dry with a clean towel.            10.  Wear clean pajamas.            11.  Place clean sheets on your bed the night of your first shower and do not  sleep with pets. Day of Surgery : Do not apply any lotions/deodorants the morning of surgery.  Please wear clean clothes to the hospital/surgery center.  FAILURE TO FOLLOW THESE INSTRUCTIONS MAY RESULT IN THE CANCELLATION OF YOUR SURGERY  PATIENT SIGNATURE_________________________________  NURSE SIGNATURE__________________________________  ________________________________________________________________________  WHAT IS A BLOOD TRANSFUSION? Blood Transfusion Information  A transfusion is the replacement of blood or some of its parts. Blood is made up of multiple cells which provide different functions.  Red blood cells carry oxygen and are used for blood loss replacement.  White blood cells fight against infection.  Platelets control bleeding.  Plasma helps clot blood.  Other blood products are available for specialized needs, such as hemophilia or other clotting disorders. BEFORE THE TRANSFUSION  Who gives blood for transfusions?   Healthy volunteers who are fully evaluated to make sure their blood is safe. This is blood bank blood. Transfusion therapy is the safest it has ever been in the  practice of medicine. Before blood is taken from a donor, a complete history is taken to make sure that person has no history of diseases nor engages in risky social behavior (examples are intravenous drug use or sexual activity with multiple partners). The donor's travel history is screened to minimize risk of transmitting infections, such as malaria. The donated blood is tested for signs of infectious diseases, such as HIV and hepatitis. The blood is then tested to be sure it is compatible with you in order to minimize the chance of a transfusion reaction. If you or a relative donates blood, this is often done in anticipation of surgery and is not appropriate for emergency situations. It takes many days to process the donated blood. RISKS AND COMPLICATIONS Although transfusion therapy is very safe and saves many lives, the main dangers of transfusion include:   Getting an infectious disease.  Developing a transfusion reaction. This is an allergic reaction to something in the blood you were given. Every precaution is taken to prevent this. The decision to have a blood transfusion has been considered carefully by your caregiver before blood is given. Blood is not given unless the benefits outweigh the risks. AFTER THE TRANSFUSION  Right after receiving a blood transfusion, you will usually feel much better and more energetic. This is especially true if your red blood cells have gotten low (anemic). The transfusion raises the level of the red blood cells which carry oxygen, and this usually causes an energy increase.  The nurse administering the transfusion will monitor you carefully for complications. HOME CARE INSTRUCTIONS  No special instructions are needed after a transfusion. You may find your energy is better. Speak with your caregiver about any limitations on activity for underlying diseases you may have. SEEK MEDICAL CARE IF:   Your condition is not improving after your transfusion.  You  develop redness or irritation at the intravenous (IV) site. SEEK IMMEDIATE MEDICAL CARE IF:  Any of  the following symptoms occur over the next 12 hours:  Shaking chills.  You have a temperature by mouth above 102 F (38.9 C), not controlled by medicine.  Chest, back, or muscle pain.  People around you feel you are not acting correctly or are confused.  Shortness of breath or difficulty breathing.  Dizziness and fainting.  You get a rash or develop hives.  You have a decrease in urine output.  Your urine turns a dark color or changes to pink, red, or brown. Any of the following symptoms occur over the next 10 days:  You have a temperature by mouth above 102 F (38.9 C), not controlled by medicine.  Shortness of breath.  Weakness after normal activity.  The white part of the eye turns yellow (jaundice).  You have a decrease in the amount of urine or are urinating less often.  Your urine turns a dark color or changes to pink, red, or brown. Document Released: 09/30/2000 Document Revised: 12/26/2011 Document Reviewed: 05/19/2008 [facility] Patient Information 2014 [place], [place].  _______________________________________________________________________

## 2020-12-14 ENCOUNTER — Telehealth: Payer: Self-pay | Admitting: Urology

## 2020-12-14 MED ORDER — SULFAMETHOXAZOLE-TRIMETHOPRIM 800-160 MG PO TABS: 1.0000 | ORAL_TABLET | Freq: Two times a day (BID) | ORAL | 0 refills | Status: DC

## 2020-12-14 NOTE — Telephone Encounter (Signed)
Called patient. Discussed that I sent in a Rx for Bactrim DS BID to start 7 days prior to her surgery (I.e. on 3/3) for preop Ucx 2/24 growing Proteus.

## 2020-12-15 ENCOUNTER — Other Ambulatory Visit: Payer: Self-pay

## 2020-12-15 ENCOUNTER — Encounter (HOSPITAL_COMMUNITY): Payer: Self-pay

## 2020-12-15 ENCOUNTER — Encounter (HOSPITAL_COMMUNITY)
Admission: RE | Admit: 2020-12-15 | Discharge: 2020-12-15 | Disposition: A | Discharge status: Home / Self Care | Payer: No Typology Code available for payment source | Source: Ambulatory Visit | Attending: Urology | Admitting: Urology

## 2020-12-15 DIAGNOSIS — Z01812 Encounter for preprocedural laboratory examination: Secondary | ICD-10-CM | POA: Insufficient documentation

## 2020-12-15 HISTORY — DX: Other specified postprocedural states: R11.2

## 2020-12-15 HISTORY — DX: Other specified postprocedural states: Z98.890

## 2020-12-15 LAB — CBC
HCT: 40.5 % (ref 36.0–46.0)
Hemoglobin: 12.8 g/dL (ref 12.0–15.0)
MCH: 28.1 pg (ref 26.0–34.0)
MCHC: 31.6 g/dL (ref 30.0–36.0)
MCV: 89 fL (ref 80.0–100.0)
Platelets: 320 10*3/uL (ref 150–400)
RBC: 4.55 MIL/uL (ref 3.87–5.11)
RDW: 14.5 % (ref 11.5–15.5)
WBC: 6 10*3/uL (ref 4.0–10.5)
nRBC: 0 % (ref 0.0–0.2)

## 2020-12-15 LAB — COMPREHENSIVE METABOLIC PANEL
ALT: 35 U/L (ref 0–44)
AST: 26 U/L (ref 15–41)
Albumin: 3.8 g/dL (ref 3.5–5.0)
Alkaline Phosphatase: 80 U/L (ref 38–126)
Anion gap: 4 — ABNORMAL LOW (ref 5–15)
BUN: 12 mg/dL (ref 6–20)
CO2: 29 mmol/L (ref 22–32)
Calcium: 9 mg/dL (ref 8.9–10.3)
Chloride: 106 mmol/L (ref 98–111)
Creatinine, Ser: 0.72 mg/dL (ref 0.44–1.00)
GFR, Estimated: >60 mL/min (ref >60)
Glucose, Bld: 97 mg/dL (ref 70–99)
Potassium: 3.8 mmol/L (ref 3.5–5.1)
Sodium: 139 mmol/L (ref 135–145)
Total Bilirubin: 0.3 mg/dL (ref 0.3–1.2)
Total Protein: 7.8 g/dL (ref 6.5–8.1)

## 2020-12-16 LAB — URINE CULTURE

## 2020-12-16 NOTE — Progress Notes (Signed)
Urine culture results sent to Dr. Herrick to review. 

## 2020-12-17 ENCOUNTER — Ambulatory Visit
Admission: RE | Admit: 2020-12-17 | Discharge: 2020-12-17 | Disposition: A | Discharge status: Home / Self Care | Payer: No Typology Code available for payment source | Source: Ambulatory Visit | Attending: Obstetrics and Gynecology | Admitting: Obstetrics and Gynecology

## 2020-12-17 ENCOUNTER — Other Ambulatory Visit: Payer: Self-pay

## 2020-12-17 ENCOUNTER — Ambulatory Visit: Payer: Self-pay | Admitting: *Deleted

## 2020-12-17 VITALS — BP 122/84 | Wt 211.5 lb

## 2020-12-17 DIAGNOSIS — Z1231 Encounter for screening mammogram for malignant neoplasm of breast: Secondary | ICD-10-CM

## 2020-12-17 DIAGNOSIS — Z01419 Encounter for gynecological examination (general) (routine) without abnormal findings: Secondary | ICD-10-CM

## 2020-12-17 NOTE — Progress Notes (Addendum)
[name] is a 40 y.o. [id number] female who presents to [facility] clinic today with no complaints.   Pap Smear: Pap smear completed today. Last Pap smear was 11/23/2017 at the [facility] clinic and was normal. Patient has a history of two abnormal Pap smears 02/18/2011 that was LSIL and 05/24/2011 hat was ASCUS. 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 The above Pap smear, colposcopy, and LEEP results are available in Epic.   Physical exam: Breasts Breasts symmetrical. No skin abnormalities bilateral breasts. No nipple retraction bilateral breasts. No nipple discharge bilateral breasts. No lymphadenopathy. No lumps palpated bilateral breasts. No complaints of pain or tenderness on exam.     MS DIGITAL SCREENING TOMO BILATERAL  Result Date: 11/18/2019 CLINICAL DATA:  Screening. EXAM: DIGITAL SCREENING BILATERAL MAMMOGRAM WITH TOMO AND CAD COMPARISON:  None. ACR Breast Density Category c: The breast tissue is heterogeneously dense, which may obscure small masses FINDINGS: There are no findings suspicious for malignancy. Images were processed with CAD. IMPRESSION: No mammographic evidence of malignancy. A result letter of this screening mammogram will be mailed directly to the patient. RECOMMENDATION: Screening mammogram in one year. (Code:[id number]-01Y) BI-RADS CATEGORY  1: Negative. Electronically Signed   By: Sande Brothers M.D.   On: 11/18/2019 10:33     Pelvic/Bimanual Ext Genitalia No lesions, no swelling and no discharge observed on external genitalia.        Vagina Vagina pink and normal texture. No lesions or discharge observed in vagina.        Cervix Cervix is present. Cervix pink and of normal texture. No discharge observed.    Uterus Uterus is present and palpable. Uterus in normal position and normal size.        Adnexae Bilateral ovaries present and palpable. No tenderness  on palpation.         Rectovaginal No rectal exam completed today since patient had no rectal complaints. No skin abnormalities observed on exam.     Smoking History: Patient has never smoked.   Patient Navigation: Patient education provided. Access to services provided for patient through [facility] program. Spanish interpreter Natale Lay from [facility] provided.    Breast and Cervical Cancer Risk Assessment: Patient does not have family history of breast cancer, known genetic mutations, or radiation treatment to the chest before age 79. Per patient has history of cervical dysplasia. Patient has no history of being immunocompromised or DES exposure in-utero.  Risk Assessment     Risk Scores       12/17/2020   Last edited by: Meryl Dare, CMA   5-year risk: 0.3 %   Lifetime risk: 5.1 %            A: B[facility]CP exam with pap smear No complaints.  P: Referred patient to the [facility] for a screening mammogram on the mobile unit. Appointment scheduled Thursday, December 17, 2020 at 0930.  Priscille Heidelberg, RN 12/17/2020 9:21 AM

## 2020-12-17 NOTE — Patient Instructions (Signed)
Explained breast self awareness with Bud Face. Pap smear completed today. Let her know BCCCP will cover Pap smears and HPV typing every 5 years unless has a history of abnormal Pap smears. Referred patient to the [facility] for a screening mammogram on the mobile unit. Appointment scheduled Thursday, December 17, 2020 at 0930. Patient escorted to mobile unit following BCCCP appointment for her screening mammogram. Let patient know will follow up with her within the next couple weeks with results of her Pap smear by letter or phone. Informed patient that the Breast Center will follow-up with her within the next couple of weeks with results of her mammogram by letter or phone. Ruth Stewart verbalized understanding.  Isela Stantz, Kathaleen Maser, RN 9:21 AM

## 2020-12-21 ENCOUNTER — Other Ambulatory Visit (HOSPITAL_COMMUNITY)
Admission: RE | Admit: 2020-12-21 | Discharge: 2020-12-21 | Disposition: A | Discharge status: Home / Self Care | Payer: No Typology Code available for payment source | Source: Ambulatory Visit | Attending: Urology | Admitting: Urology

## 2020-12-21 DIAGNOSIS — Z20822 Contact with and (suspected) exposure to covid-19: Secondary | ICD-10-CM | POA: Insufficient documentation

## 2020-12-21 DIAGNOSIS — Z01812 Encounter for preprocedural laboratory examination: Secondary | ICD-10-CM | POA: Insufficient documentation

## 2020-12-21 LAB — SARS CORONAVIRUS 2 (TAT 6-24 HRS): SARS Coronavirus 2: NEGATIVE

## 2020-12-22 LAB — CYTOLOGY - PAP
Comment: NEGATIVE
Diagnosis: NEGATIVE
High risk HPV: NEGATIVE

## 2020-12-23 ENCOUNTER — Telehealth: Payer: Self-pay

## 2020-12-23 ENCOUNTER — Encounter (HOSPITAL_COMMUNITY): Payer: Self-pay | Admitting: Urology

## 2020-12-23 MED ORDER — GENTAMICIN SULFATE 40 MG/ML IJ SOLN
5.0000 mg/kg | INTRAVENOUS | Status: AC
  Administered 2020-12-24: 340 mg via INTRAVENOUS
  Filled 2020-12-23: qty 8.5

## 2020-12-23 NOTE — Telephone Encounter (Signed)
Called patient via [employer] [phone]. Informed patient that pap was normal and HPV negative. Next pap smear will be due in 5 years. Patient voiced understanding.

## 2020-12-23 NOTE — Anesthesia Preprocedure Evaluation (Addendum)
Anesthesia Evaluation  Patient identified by MRN, date of birth, ID band Patient awake    Reviewed: Allergy & Precautions, NPO status , Patient's Chart, lab work & pertinent test results, reviewed documented beta blocker date and time   History of Anesthesia Complications (+) PONV and history of anesthetic complications  Airway Mallampati: II  TM Distance: >3 FB Neck ROM: Full    Dental no notable dental hx. (+) Teeth Intact, Caps, Dental Advisory Given   Pulmonary    Pulmonary exam normal breath sounds clear to auscultation       Cardiovascular negative cardio ROS Normal cardiovascular exam Rhythm:Regular Rate:Normal     Neuro/Psych negative neurological ROS  negative psych ROS   GI/Hepatic negative GI ROS, Neg liver ROS,   Endo/Other  Obesity  Renal/GU Renal diseaseHx/o nephrolithiasis Left partial staghorn calculus  negative genitourinary   Musculoskeletal negative musculoskeletal ROS (+)   Abdominal (+) + obese,   Peds  Hematology  (+) anemia ,   Anesthesia Other Findings   Reproductive/Obstetrics                            Anesthesia Physical Anesthesia Plan  ASA: II  Anesthesia Plan: General   Post-op Pain Management:    Induction:   PONV Risk Score and Plan: 4 or greater and Scopolamine patch - Pre-op, Midazolam, Treatment may vary due to age or medical condition, Dexamethasone, Ondansetron and Amisulpride  Airway Management Planned: Oral ETT  Additional Equipment:   Intra-op Plan:   Post-operative Plan: Extubation in OR  Informed Consent: I have reviewed the patients History and Physical, chart, labs and discussed the procedure including the risks, benefits and alternatives for the proposed anesthesia with the patient or authorized representative who has indicated his/her understanding and acceptance.     Dental advisory given  Plan Discussed with: CRNA and  Anesthesiologist  Anesthesia Plan Comments:        Anesthesia Quick Evaluation

## 2020-12-23 NOTE — Telephone Encounter (Signed)
Via Julie Sowell, Spanish Interpreter, Patient informed negative Pap/HPV results, next pap due in 5 years. Patient verbalized understanding.  

## 2020-12-24 ENCOUNTER — Ambulatory Visit (HOSPITAL_COMMUNITY): Payer: No Typology Code available for payment source

## 2020-12-24 ENCOUNTER — Ambulatory Visit (HOSPITAL_COMMUNITY): Payer: No Typology Code available for payment source | Admitting: Anesthesiology

## 2020-12-24 ENCOUNTER — Other Ambulatory Visit: Payer: Self-pay

## 2020-12-24 ENCOUNTER — Encounter (HOSPITAL_COMMUNITY)
Admission: RE | Disposition: A | Discharge status: Home / Self Care | Payer: Self-pay | Source: Ambulatory Visit | Attending: Urology

## 2020-12-24 ENCOUNTER — Encounter (HOSPITAL_COMMUNITY): Payer: Self-pay | Admitting: Urology

## 2020-12-24 ENCOUNTER — Observation Stay (HOSPITAL_COMMUNITY)
Admission: RE | Admit: 2020-12-24 | Discharge: 2020-12-25 | Disposition: A | Discharge status: Home / Self Care | Payer: No Typology Code available for payment source | Source: Ambulatory Visit | Attending: Urology | Admitting: Urology

## 2020-12-24 DIAGNOSIS — Z791 Long term (current) use of non-steroidal anti-inflammatories (NSAID): Secondary | ICD-10-CM | POA: Insufficient documentation

## 2020-12-24 DIAGNOSIS — N209 Urinary calculus, unspecified: Secondary | ICD-10-CM | POA: Diagnosis present

## 2020-12-24 DIAGNOSIS — Z79891 Long term (current) use of opiate analgesic: Secondary | ICD-10-CM | POA: Insufficient documentation

## 2020-12-24 DIAGNOSIS — N200 Calculus of kidney: Principal | ICD-10-CM | POA: Insufficient documentation

## 2020-12-24 DIAGNOSIS — K9189 Other postprocedural complications and disorders of digestive system: Secondary | ICD-10-CM | POA: Insufficient documentation

## 2020-12-24 HISTORY — PX: NEPHROLITHOTOMY: SHX5134

## 2020-12-24 LAB — TYPE AND SCREEN
ABO/RH(D): A POS
Antibody Screen: POSITIVE
Unit division: 0
Unit division: 0

## 2020-12-24 LAB — BASIC METABOLIC PANEL
Anion gap: 8 (ref 5–15)
BUN: 10 mg/dL (ref 6–20)
CO2: 23 mmol/L (ref 22–32)
Calcium: 8.4 mg/dL — ABNORMAL LOW (ref 8.9–10.3)
Chloride: 107 mmol/L (ref 98–111)
Creatinine, Ser: 0.81 mg/dL (ref 0.44–1.00)
GFR, Estimated: >60 mL/min (ref >60)
Glucose, Bld: 127 mg/dL — ABNORMAL HIGH (ref 70–99)
Potassium: 3.5 mmol/L (ref 3.5–5.1)
Sodium: 138 mmol/L (ref 135–145)

## 2020-12-24 LAB — BPAM RBC
Blood Product Expiration Date: 202204052359
Blood Product Expiration Date: 202204052359
Unit Type and Rh: 6200
Unit Type and Rh: 6200

## 2020-12-24 LAB — CBC
HCT: 37.5 % (ref 36.0–46.0)
Hemoglobin: 11.7 g/dL — ABNORMAL LOW (ref 12.0–15.0)
MCH: 28.1 pg (ref 26.0–34.0)
MCHC: 31.2 g/dL (ref 30.0–36.0)
MCV: 90.1 fL (ref 80.0–100.0)
Platelets: 179 10*3/uL (ref 150–400)
RBC: 4.16 MIL/uL (ref 3.87–5.11)
RDW: 14.8 % (ref 11.5–15.5)
WBC: 5.3 10*3/uL (ref 4.0–10.5)
nRBC: 0 % (ref 0.0–0.2)

## 2020-12-24 LAB — PREGNANCY, URINE: Preg Test, Ur: NEGATIVE

## 2020-12-24 SURGERY — NEPHROLITHOTOMY PERCUTANEOUS
Anesthesia: General | Laterality: Left

## 2020-12-24 MED ORDER — CHLORHEXIDINE GLUCONATE 0.12 % MT SOLN
15.0000 mL | Freq: Once | OROMUCOSAL | Status: AC
  Administered 2020-12-24: 15 mL via OROMUCOSAL

## 2020-12-24 MED ORDER — FENTANYL CITRATE (PF) 100 MCG/2ML IJ SOLN
INTRAMUSCULAR | Status: DC | PRN
  Administered 2020-12-24: 50 ug via INTRAVENOUS
  Administered 2020-12-24: 100 ug via INTRAVENOUS

## 2020-12-24 MED ORDER — SODIUM CHLORIDE 0.9 % IR SOLN
Status: DC | PRN
  Administered 2020-12-24: 6000 mL

## 2020-12-24 MED ORDER — CHLORHEXIDINE GLUCONATE CLOTH 2 % EX PADS
6.0000 | MEDICATED_PAD | Freq: Every day | CUTANEOUS | Status: DC
  Administered 2020-12-25: 6 via TOPICAL

## 2020-12-24 MED ORDER — PROPOFOL 500 MG/50ML IV EMUL
INTRAVENOUS | Status: AC
  Filled 2020-12-24: qty 50

## 2020-12-24 MED ORDER — EPHEDRINE SULFATE-NACL 50-0.9 MG/10ML-% IV SOSY
PREFILLED_SYRINGE | INTRAVENOUS | Status: DC | PRN
  Administered 2020-12-24: 10 mg via INTRAVENOUS
  Administered 2020-12-24: 5 mg via INTRAVENOUS

## 2020-12-24 MED ORDER — FENTANYL CITRATE (PF) 250 MCG/5ML IJ SOLN
INTRAMUSCULAR | Status: AC
  Filled 2020-12-24: qty 5

## 2020-12-24 MED ORDER — ONDANSETRON HCL 4 MG/2ML IJ SOLN
INTRAMUSCULAR | Status: AC
  Filled 2020-12-24: qty 2

## 2020-12-24 MED ORDER — DEXAMETHASONE SODIUM PHOSPHATE 10 MG/ML IJ SOLN
INTRAMUSCULAR | Status: AC
  Filled 2020-12-24: qty 1

## 2020-12-24 MED ORDER — DEXTROSE-NACL 5-0.45 % IV SOLN: INTRAVENOUS | Status: DC

## 2020-12-24 MED ORDER — ROCURONIUM BROMIDE 10 MG/ML (PF) SYRINGE
PREFILLED_SYRINGE | INTRAVENOUS | Status: AC
  Filled 2020-12-24: qty 10

## 2020-12-24 MED ORDER — CEFAZOLIN SODIUM-DEXTROSE 2-4 GM/100ML-% IV SOLN
2.0000 g | INTRAVENOUS | Status: AC
  Administered 2020-12-24: 2 g via INTRAVENOUS
  Filled 2020-12-24: qty 100

## 2020-12-24 MED ORDER — ROCURONIUM BROMIDE 10 MG/ML (PF) SYRINGE
PREFILLED_SYRINGE | INTRAVENOUS | Status: DC | PRN
  Administered 2020-12-24 (×2): 10 mg via INTRAVENOUS
  Administered 2020-12-24: 60 mg via INTRAVENOUS

## 2020-12-24 MED ORDER — MORPHINE SULFATE (PF) 2 MG/ML IV SOLN: 1.0000 mg | INTRAVENOUS | Status: DC | PRN

## 2020-12-24 MED ORDER — PROPOFOL 10 MG/ML IV BOLUS
INTRAVENOUS | Status: AC
  Filled 2020-12-24: qty 20

## 2020-12-24 MED ORDER — IOHEXOL 300 MG/ML  SOLN
INTRAMUSCULAR | Status: DC | PRN
  Administered 2020-12-24: 65 mL

## 2020-12-24 MED ORDER — PROPOFOL 1000 MG/100ML IV EMUL
INTRAVENOUS | Status: AC
  Filled 2020-12-24: qty 100

## 2020-12-24 MED ORDER — DROPERIDOL 2.5 MG/ML IJ SOLN
0.6250 mg | Freq: Once | INTRAMUSCULAR | Status: AC | PRN
  Administered 2020-12-24: 0.625 mg via INTRAVENOUS

## 2020-12-24 MED ORDER — LIDOCAINE 2% (20 MG/ML) 5 ML SYRINGE
INTRAMUSCULAR | Status: DC | PRN
  Administered 2020-12-24: 60 mg via INTRAVENOUS

## 2020-12-24 MED ORDER — LIDOCAINE 2% (20 MG/ML) 5 ML SYRINGE
INTRAMUSCULAR | Status: AC
  Filled 2020-12-24: qty 5

## 2020-12-24 MED ORDER — ONDANSETRON HCL 4 MG/2ML IJ SOLN
4.0000 mg | Freq: Once | INTRAMUSCULAR | Status: AC | PRN
  Administered 2020-12-24: 4 mg via INTRAVENOUS

## 2020-12-24 MED ORDER — LACTATED RINGERS IV SOLN: INTRAVENOUS | Status: DC

## 2020-12-24 MED ORDER — ACETAMINOPHEN 500 MG PO TABS
1000.0000 mg | ORAL_TABLET | Freq: Four times a day (QID) | ORAL | Status: DC
  Administered 2020-12-24 – 2020-12-25 (×4): 1000 mg via ORAL
  Filled 2020-12-24 (×4): qty 2

## 2020-12-24 MED ORDER — MIDAZOLAM HCL 5 MG/5ML IJ SOLN
INTRAMUSCULAR | Status: DC | PRN
  Administered 2020-12-24: 2 mg via INTRAVENOUS

## 2020-12-24 MED ORDER — HYDROMORPHONE HCL 1 MG/ML IJ SOLN
0.2500 mg | INTRAMUSCULAR | Status: DC | PRN
  Administered 2020-12-24 (×4): 0.5 mg via INTRAVENOUS

## 2020-12-24 MED ORDER — SENNA 8.6 MG PO TABS
1.0000 | ORAL_TABLET | Freq: Two times a day (BID) | ORAL | Status: DC
  Administered 2020-12-24 – 2020-12-25 (×2): 8.6 mg via ORAL
  Filled 2020-12-24 (×2): qty 1

## 2020-12-24 MED ORDER — DEXAMETHASONE SODIUM PHOSPHATE 10 MG/ML IJ SOLN
INTRAMUSCULAR | Status: DC | PRN
  Administered 2020-12-24: 10 mg via INTRAVENOUS

## 2020-12-24 MED ORDER — ORAL CARE MOUTH RINSE: 15.0000 mL | Freq: Once | OROMUCOSAL | Status: AC

## 2020-12-24 MED ORDER — HYDROMORPHONE HCL 1 MG/ML IJ SOLN: 0.5000 mg | Freq: Once | INTRAMUSCULAR | Status: DC

## 2020-12-24 MED ORDER — ACETAMINOPHEN 10 MG/ML IV SOLN
1000.0000 mg | Freq: Once | INTRAVENOUS | Status: AC
  Administered 2020-12-24: 1000 mg via INTRAVENOUS

## 2020-12-24 MED ORDER — HYDROMORPHONE HCL 1 MG/ML IJ SOLN
INTRAMUSCULAR | Status: AC
  Filled 2020-12-24: qty 1

## 2020-12-24 MED ORDER — ONDANSETRON 4 MG PO TBDP: 4.0000 mg | ORAL_TABLET | Freq: Four times a day (QID) | ORAL | Status: DC | PRN

## 2020-12-24 MED ORDER — SODIUM CHLORIDE 0.9 % IV SOLN
2.0000 g | INTRAVENOUS | Status: DC
  Administered 2020-12-24: 2 g via INTRAVENOUS
  Filled 2020-12-24: qty 2
  Filled 2020-12-24: qty 20

## 2020-12-24 MED ORDER — SCOPOLAMINE 1 MG/3DAYS TD PT72
1.0000 | MEDICATED_PATCH | TRANSDERMAL | Status: DC
  Administered 2020-12-24: 1.5 mg via TRANSDERMAL
  Filled 2020-12-24: qty 1

## 2020-12-24 MED ORDER — ONDANSETRON HCL 4 MG/2ML IJ SOLN: 4.0000 mg | Freq: Four times a day (QID) | INTRAMUSCULAR | Status: DC | PRN

## 2020-12-24 MED ORDER — OXYCODONE HCL 5 MG/5ML PO SOLN: 5.0000 mg | Freq: Once | ORAL | Status: DC | PRN

## 2020-12-24 MED ORDER — BUPIVACAINE-EPINEPHRINE 0.5% -1:200000 IJ SOLN
INTRAMUSCULAR | Status: DC | PRN
  Administered 2020-12-24: 19 mL

## 2020-12-24 MED ORDER — PROPOFOL 10 MG/ML IV BOLUS
INTRAVENOUS | Status: DC | PRN
  Administered 2020-12-24: 150 mg via INTRAVENOUS

## 2020-12-24 MED ORDER — OXYCODONE HCL 5 MG PO TABS
5.0000 mg | ORAL_TABLET | ORAL | Status: DC | PRN
Start: 2020-12-24 — End: 2020-12-25
  Administered 2020-12-24: 5 mg via ORAL
  Administered 2020-12-24 – 2020-12-25 (×2): 10 mg via ORAL
  Filled 2020-12-24: qty 1
  Filled 2020-12-24 (×2): qty 2

## 2020-12-24 MED ORDER — SUGAMMADEX SODIUM 200 MG/2ML IV SOLN
INTRAVENOUS | Status: DC | PRN
  Administered 2020-12-24: 200 mg via INTRAVENOUS

## 2020-12-24 MED ORDER — BUPIVACAINE-EPINEPHRINE 0.5% -1:200000 IJ SOLN
INTRAMUSCULAR | Status: AC
  Filled 2020-12-24: qty 1

## 2020-12-24 MED ORDER — ACETAMINOPHEN 10 MG/ML IV SOLN
INTRAVENOUS | Status: AC
  Filled 2020-12-24: qty 100

## 2020-12-24 MED ORDER — OXYCODONE HCL 5 MG PO TABS: 5.0000 mg | ORAL_TABLET | Freq: Once | ORAL | Status: DC | PRN

## 2020-12-24 MED ORDER — MIDAZOLAM HCL 2 MG/2ML IJ SOLN
INTRAMUSCULAR | Status: AC
  Filled 2020-12-24: qty 2

## 2020-12-24 MED ORDER — ONDANSETRON HCL 4 MG/2ML IJ SOLN
INTRAMUSCULAR | Status: DC | PRN
  Administered 2020-12-24: 4 mg via INTRAVENOUS

## 2020-12-24 MED ORDER — DOCUSATE SODIUM 100 MG PO CAPS
100.0000 mg | ORAL_CAPSULE | Freq: Two times a day (BID) | ORAL | Status: DC
  Administered 2020-12-24 – 2020-12-25 (×2): 100 mg via ORAL
  Filled 2020-12-24 (×2): qty 1

## 2020-12-24 MED ORDER — DROPERIDOL 2.5 MG/ML IJ SOLN
INTRAMUSCULAR | Status: AC
  Filled 2020-12-24: qty 2

## 2020-12-24 SURGICAL SUPPLY — 58 items
BAG URINE DRAIN 2000ML AR STRL (UROLOGICAL SUPPLIES) IMPLANT
BASKET STONE NCOMPASS (UROLOGICAL SUPPLIES) IMPLANT
BASKET ZERO TIP NITINOL 2.4FR (BASKET) ×2 IMPLANT
BENZOIN TINCTURE PRP APPL 2/3 (GAUZE/BANDAGES/DRESSINGS) ×2 IMPLANT
BLADE SURG 15 STRL LF DISP TIS (BLADE) ×1 IMPLANT
BLADE SURG 15 STRL SS (BLADE) ×1
BOOTIES KNEE HIGH SLOAN (MISCELLANEOUS) IMPLANT
CATH AINSWORTH 30CC 24FR (CATHETERS) ×2 IMPLANT
CATH URET 5FR 28IN OPEN ENDED (CATHETERS) ×2 IMPLANT
CATH URET DUAL LUMEN 6-10FR 50 (CATHETERS) ×2 IMPLANT
CATH UROLOGY TORQUE 40 (MISCELLANEOUS) ×2 IMPLANT
CATH UROLOGY TORQUE 65 (CATHETERS) IMPLANT
CATH X-FORCE N30 NEPHROSTOMY (TUBING) ×2 IMPLANT
CHLORAPREP W/TINT 26 (MISCELLANEOUS) ×2 IMPLANT
COVER WAND RF STERILE (DRAPES) IMPLANT
DRAPE C-ARM 42X120 X-RAY (DRAPES) ×2 IMPLANT
DRAPE LINGEMAN PERC (DRAPES) ×2 IMPLANT
DRAPE SURG IRRIG POUCH 19X23 (DRAPES) ×4 IMPLANT
DRSG PAD ABDOMINAL 8X10 ST (GAUZE/BANDAGES/DRESSINGS) IMPLANT
DRSG TEGADERM 8X12 (GAUZE/BANDAGES/DRESSINGS) ×2 IMPLANT
EXTRACTOR STONE 1.7FRX115CM (UROLOGICAL SUPPLIES) IMPLANT
GAUZE SPONGE 4X4 12PLY STRL (GAUZE/BANDAGES/DRESSINGS) IMPLANT
GLOVE SURG ENC TEXT LTX SZ7.5 (GLOVE) ×2 IMPLANT
GOWN STRL REUS W/TWL XL LVL3 (GOWN DISPOSABLE) ×2 IMPLANT
GUIDEWIRE AMPLAZ .035X145 (WIRE) ×4 IMPLANT
GUIDEWIRE ANG ZIPWIRE 038X150 (WIRE) IMPLANT
GUIDEWIRE STR DUAL SENSOR (WIRE) ×2 IMPLANT
HOLDER NEEDLE AMPLATZ W/INSERT (MISCELLANEOUS) IMPLANT
IV SET EXTENSION CATH 6 NF (IV SETS) ×2 IMPLANT
KIT BASIN OR (CUSTOM PROCEDURE TRAY) ×2 IMPLANT
KIT PROBE 340X3.4XDISP GRN (MISCELLANEOUS) IMPLANT
KIT PROBE TRILOGY 3.4X340 (MISCELLANEOUS)
KIT PROBE TRILOGY 3.9X350 (MISCELLANEOUS) ×2 IMPLANT
KIT TURNOVER KIT A (KITS) ×2 IMPLANT
LASER FIB FLEXIVA PULSE ID 365 (Laser) ×2 IMPLANT
MANIFOLD NEPTUNE II (INSTRUMENTS) ×2 IMPLANT
NEEDLE SPNL 20GX3.5 QUINCKE YW (NEEDLE) IMPLANT
NEEDLE TROCAR 18X15 ECHO (NEEDLE) IMPLANT
NEEDLE TROCAR 18X20 (NEEDLE) IMPLANT
NS IRRIG 1000ML POUR BTL (IV SOLUTION) ×2 IMPLANT
PACK CYSTO (CUSTOM PROCEDURE TRAY) ×2 IMPLANT
SHEATH PEELAWAY SET 9 (SHEATH) IMPLANT
SPONGE DRAIN TRACH 4X4 STRL 2S (GAUZE/BANDAGES/DRESSINGS) ×2 IMPLANT
SPONGE LAP 4X18 RFD (DISPOSABLE) ×2 IMPLANT
STENT URET 6FRX24 CONTOUR (STENTS) ×2 IMPLANT
SUT ETHILON 3 0 PS 1 (SUTURE) IMPLANT
SUT SILK 0 (SUTURE) ×1
SUT SILK 0 30XBRD TIE 6 (SUTURE) ×1 IMPLANT
SYR 10ML LL (SYRINGE) ×2 IMPLANT
SYR 20ML LL LF (SYRINGE) ×4 IMPLANT
SYR 50ML LL SCALE MARK (SYRINGE) ×2 IMPLANT
TOWEL OR 17X26 10 PK STRL BLUE (TOWEL DISPOSABLE) ×2 IMPLANT
TRACTIP FLEXIVA PULS ID 200XHI (Laser) IMPLANT
TRACTIP FLEXIVA PULSE ID 200 (Laser)
TRAY FOLEY MTR SLVR 16FR STAT (SET/KITS/TRAYS/PACK) ×2 IMPLANT
TUBING CONNECTING 10 (TUBING) ×4 IMPLANT
TUBING STONE CATCHER TRILOGY (MISCELLANEOUS) ×2 IMPLANT
TUBING UROLOGY SET (TUBING) ×2 IMPLANT

## 2020-12-24 NOTE — Progress Notes (Addendum)
Patient c/o increased pressure and pain in bladder.  Bladder scan showing 600cc's of urine, foley not draining.  Dr. Mena Goes paged.  Verbal order to irrigate foley.  Foley irrigated with small amount of NS, 500cc's of light pink urine returned.  Pt stated she felt relief.

## 2020-12-24 NOTE — Anesthesia Procedure Notes (Signed)
Procedure Name: Intubation Date/Time: 12/24/2020 7:34 AM Performed by: Gerald Leitz, CRNA Pre-anesthesia Checklist: Patient identified, Patient being monitored, Timeout performed, Emergency Drugs available and Suction available Patient Re-evaluated:Patient Re-evaluated prior to induction Oxygen Delivery Method: Circle system utilized Preoxygenation: Pre-oxygenation with 100% oxygen Induction Type: IV induction Ventilation: Mask ventilation without difficulty Laryngoscope Size: Mac and 3 Grade View: Grade I Tube type: Oral Tube size: 7.5 mm Number of attempts: 1 Placement Confirmation: ETT inserted through vocal cords under direct vision,  positive ETCO2 and breath sounds checked- equal and bilateral Secured at: 21 cm Tube secured with: Tape Dental Injury: Teeth and Oropharynx as per pre-operative assessment

## 2020-12-24 NOTE — Transfer of Care (Signed)
Immediate Anesthesia Transfer of Care Note  Patient: Ruth Stewart  Procedure(s) Performed: Procedure(s) with comments: LEFT NEPHROLITHOTOMY PERCUTANEOUS WITH LASER LITHOTRIPSY (Left) - REQUESTING 3 HRS  Patient Location: PACU  Anesthesia Type:General  Level of Consciousness: Alert, Awake, Oriented  Airway & Oxygen Therapy: Patient Spontanous Breathing  Post-op Assessment: Report given to RN  Post vital signs: Reviewed and stable  Last Vitals:  Vitals:   12/24/20 0628  BP: (!) 129/93  Pulse: 78  Resp: 16  Temp: 37.2 C  SpO2: 98%    Complications: No apparent anesthesia complications

## 2020-12-24 NOTE — Anesthesia Postprocedure Evaluation (Signed)
Anesthesia Post Note  Patient: Ruth Stewart  Procedure(s) Performed: LEFT NEPHROLITHOTOMY PERCUTANEOUS WITH LASER LITHOTRIPSY (Left )     Patient location during evaluation: PACU Anesthesia Type: General Level of consciousness: awake and alert and oriented Pain management: pain level controlled Vital Signs Assessment: post-procedure vital signs reviewed and stable Respiratory status: spontaneous breathing, nonlabored ventilation and respiratory function stable Cardiovascular status: blood pressure returned to baseline and stable Postop Assessment: no apparent nausea or vomiting Anesthetic complications: no   No complications documented.  Last Vitals:  Vitals:   12/24/20 1015 12/24/20 1030  BP: 121/68 107/78  Pulse: 89 83  Resp: 17 19  Temp:    SpO2: 100% 100%    Last Pain:  Vitals:   12/24/20 1030  TempSrc:   PainSc: 0-No pain                 Dempsy Damiano A.

## 2020-12-24 NOTE — Plan of Care (Signed)

## 2020-12-24 NOTE — Progress Notes (Signed)
Marlon Pel is the interpreter at the bedside.  She will leave while patient goes to surgery.  She said to call her when patient is out of surgery, her number is on the inside front of chart.  [phone]

## 2020-12-24 NOTE — H&P (Signed)
H&P  Chief Complaint: Nephrolithiasis  History of Present Illness: Brandii Lakey is a 40 y.o. 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  She was recently hospitalized on 2/14 with left flank pain and found to have a left staghorn stone with 3 x 1 cm obstructing UPJ component and a 2.6cm LLP branch component. She underwent left nephrostomy tube placement. Since discharge, she reports that her pain has improved with the nephrostomy tube in place.   She has a history of right PCNL ~2 years ago. Imaging with small 27mm right LLP stone.   Preop urine culture 2/24 growing proteus. Prescribed Bactrim DS BID to start 7 days prior to surgery.   Past Medical History:  Diagnosis Date  . Cervical dysplasia   . History of kidney stones   . Iron deficiency anemia    with pregnancy  . Medical history reviewed with no changes 04/09/2019  . PONV (postoperative nausea and vomiting)     Past Surgical History:  Procedure Laterality Date  . CYSTOSCOPY N/A 03/25/2019   Procedure: CYSTOSCOPY FLEXIBLE;  Surgeon: Crist Fat, MD;  Location: [facility];  Service: Urology;  Laterality: N/A;  . CYSTOSCOPY/URETEROSCOPY/HOLMIUM LASER/STENT PLACEMENT Right 04/11/2019   Procedure: CYSTOSCOPY RIGHT URETEROSCOPY WITH HOLMIUM LASER STONE EXTRACTION STENT EXCHANGE;  Surgeon: Crist Fat, MD;  Location: [facility];  Service: Urology;  Laterality: Right;  . IR NEPHROSTOMY PLACEMENT LEFT  11/30/2020  . LEEP  2011  . NEPHROLITHOTOMY Right 03/25/2019   Procedure: NEPHROLITHOTOMY PERCUTANEOUS WITH SURGEON ACCESS;  Surgeon: Crist Fat, MD;  Location: [facility];  Service: Urology;  Laterality: Right;    Home Medications:  No current facility-administered medications on file prior to encounter.   Current Outpatient Medications on File Prior to Encounter  Medication Sig Dispense Refill  . acetaminophen (TYLENOL) 500 MG tablet Take 2 tablets (1,000 mg total)  by mouth every 6 (six) hours as needed for moderate pain. Takes 2 tablets every 6 hours as needed (Patient not taking: No sig reported) 30 tablet 0  . ibuprofen (ADVIL,MOTRIN) 600 MG tablet Take 1 tablet (600 mg total) by mouth every 6 (six) hours. (Patient not taking: No sig reported) 30 tablet 0  . ondansetron (ZOFRAN) 4 MG tablet Take 1 tablet (4 mg total) by mouth every 8 (eight) hours as needed for nausea or vomiting. (Patient not taking: No sig reported) 20 tablet 0  . traMADol (ULTRAM) 50 MG tablet Take 1-2 tablets (50-100 mg total) by mouth every 6 (six) hours as needed for moderate pain. 20 tablet 0     Allergies: No Known Allergies  Family History  Problem Relation Age of Onset  . Hypertension Mother     Social History:  reports that she has never smoked. She has never used smokeless tobacco. She reports current alcohol use. She reports that she does not use drugs.  ROS: A complete review of systems was performed.  All systems are negative except for pertinent findings as noted.  Physical Exam:  Vital signs in last 24 hours: Temp:  [98.9 F (37.2 C)] 98.9 F (37.2 C) (03/10 0628) Pulse Rate:  [78] 78 (03/10 0628) Resp:  [16] 16 (03/10 0628) BP: (129)/(93) 129/93 (03/10 0628) SpO2:  [98 %] 98 % (03/10 0628) Weight:  [95.9 kg] 95.9 kg (03/10 0550) Constitutional:  Alert and oriented, No acute distress Cardiovascular: Regular rate and rhythm, No JVD Respiratory: Normal respiratory effort, Lungs clear bilaterally GI: Abdomen is  soft, nontender, nondistended, no abdominal masses GU: No CVA tenderness Lymphatic: No lymphadenopathy Neurologic: Grossly intact, no focal deficits Psychiatric: Normal mood and affect  Laboratory Data:  No results for input(s): WBC, HGB, HCT, PLT in the last 72 hours.  No results for input(s): NA, K, CL, GLUCOSE, BUN, CALCIUM, CREATININE in the last 72 hours.  Invalid input(s): CO3   Results for orders placed or performed during the  hospital encounter of 12/24/20 (from the past 24 hour(s))  Pregnancy, urine per protocol     Status: None   Collection Time: 12/24/20  5:50 AM  Result Value Ref Range   Preg Test, Ur NEGATIVE NEGATIVE   Recent Results (from the past 240 hour(s))  Urine culture     Status: Abnormal   Collection Time: 12/15/20 11:08 AM   Specimen: Urine, Clean Catch  Result Value Ref Range Status   Specimen Description   Final    URINE, CLEAN CATCH Performed at [facility], [address], [place], [place] [address]    Special Requests   Final    NONE Performed at [facility], [address], [place], [place] [address]    Culture MULTIPLE SPECIES PRESENT, SUGGEST RECOLLECTION (A)  Final   Report Status 12/16/2020 FINAL  Final  SARS CORONAVIRUS 2 (TAT 6-24 HRS) Nasopharyngeal Nasopharyngeal Swab     Status: None   Collection Time: 12/21/20 11:02 AM   Specimen: Nasopharyngeal Swab  Result Value Ref Range Status   SARS Coronavirus 2 NEGATIVE NEGATIVE Final    Comment: (NOTE) SARS-CoV-2 target nucleic acids are NOT DETECTED.  The SARS-CoV-2 RNA is generally detectable in upper and lower respiratory specimens during the acute phase of infection. Negative results do not preclude SARS-CoV-2 infection, do not rule out co-infections with other pathogens, and should not be used as the sole basis for treatment or other patient management decisions. Negative results must be combined with clinical observations, patient history, and epidemiological information. The expected result is Negative.  Fact Sheet for Patients: HairSlick.no  Fact Sheet for Healthcare Providers: quierodirigir.com  This test is not yet approved or cleared by the [place] FDA and  has been authorized for detection and/or diagnosis of SARS-CoV-2 by FDA under an Emergency Use Authorization (EUA). This EUA will remain  in effect (meaning  this test can be used) for the duration of the COVID-19 declaration under Se ction 564(b)(1) of the Act, 21 U.S.C. section 360bbb-3(b)(1), unless the authorization is terminated or revoked sooner.  Performed at [facility], [address], [place], [place] [address]     Renal Function: No results for input(s): CREATININE in the last 168 hours. Estimated Creatinine Clearance: 98.9 mL/min (by C-G formula based on SCr of 0.72 mg/dL).  Radiologic Imaging: EXAM: CT ABDOMEN AND PELVIS WITHOUT CONTRAST  TECHNIQUE: Multidetector CT imaging of the abdomen and pelvis was performed following the standard protocol without IV contrast.  COMPARISON:  03/26/2019  FINDINGS: Lower chest: Mild atelectasis at the bases, greatest in the right lower lobe.  Hepatobiliary: Stable small low-density in the right liver, likely cyst.No evidence of biliary obstruction or stone.  Pancreas: Unremarkable.  Spleen: Unremarkable.  Adrenals/Urinary Tract: Negative adrenals. Low-density left renal expansion with perinephric stranding and hydronephrosis related to a elongated stone at the UPJ which measures 3 cm in length and up to 1 cm in thickness. Superiorly there is the shape of a calyx cast. Branching left lower pole calculus measuring up to 2.6 cm in length,  significantly progressed from 2020.  Prior nephrostolithotomy on the right. Right lower pole calculus measuring 5 mm. Likely chronic right urothelial thickening superiorly.  Unremarkable bladder.  Stomach/Bowel: No obstruction. No visible bowel inflammation, including appendicitis.  Vascular/Lymphatic: No acute vascular abnormality. No mass or adenopathy.  Reproductive:No pathologic findings.  Other: No ascites or pneumoperitoneum.  Fatty umbilical hernia  Musculoskeletal: No acute abnormalities.  IMPRESSION: 1. High-grade left urinary obstruction from a 3 x 1 cm stone at the UPJ. 2. Branching left lower  pole calculus, significantly progressed from a 2020 CT. 3. 5 mm right renal calculus.  Electronically Signed: By: Marnee Spring M.D. On: 11/30/2020 05:25  Impression/Assessment:  Ariel Dimitri is a 40 y.o. 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 She was prescribed Bactrim DS BID x 7 days for positive preop Ucx growing proteus.   Plan:  - Proceed with left PCNL as planned - Anticipate patient staying 1-2 days in hospital post-procedure  Margette Fast 12/24/2020, 6:31 AM  Moody Bruins. MD

## 2020-12-24 NOTE — Op Note (Addendum)
Preoperative Diagnosis: Left nephrolithiasis (total stone burden > 2 cm)   Postoperative Diagnosis: Left nephrolithiasis (total stone burden > 2 cm)   Procedure(s) Performed: 1. Left percutaneous nephrostolithotomy for stone burden greater than 2 cm 2. Simple Foley catheter placement 3. Intraoperative fluoroscopy with interpretation less than 1 hour 4. Left JJ ureteral stent placement, 6 Fr x 24 cm without dangler   Attending Surgeon:  Berniece Salines, M.D.   Resident Surgeon:  Verline Lema.   Assistants:  None listed   Anesthesia:  General via endotracheal tube.     IV Fluids:  See Anesthesia record.   Estimated Blood Loss:  100 mLs.   Cultures: None   Drains: 16 French Foley catheter  Implants: 6 Fr x 24 cm JJ left ureteral stent, without dangler   Specimens: Left renal stone   Complications:  None.   Indications for Surgery:  Ruth Stewart is a 40 y.o. female with history of nephrolithiasis s/p right PCNL + 2nd look USE in 03/2019 and recently diagnosed large LEFT stone burden (3 x 1 cm obstructing UPJ component and a 2.6cm LLP branch component) s/p left PCN placement on 11/30/20. She presents today for percutaneous treatment of her left nephrolithiasis. The risks and benefits of the procedure were discussed using a spanish translater with the patient, who wishes to proceed. Informed consent was obtained.    Operative Findings:  - We used the prior lower pole access to gain entry to the left collecting system, where we treated the entirety of the patient's renal stone burden. No residual stones noted on subsequent nephroscopy - Successful placement of left JJ ureteral stent without string   Radiologic Interpretation: - Antegrade pyelogram at beginning of case demonstrated lower pole access with large UPJ stone and partial lower pole staghorn stone - No residual stones visible on fluoroscopy at completion of case. Antegrade pyelogram without filling defect and  widely-patent ureter - Successful placement of left JJ ureteral stent  Procedure:   The patient was correctly identified in the preoperative holding area, where written informed consent was obtained. The patient was brought back to the operative suit, and a preinduction timeout was performed. Once correct information was verified, general anesthesia was induced via endotracheal tube. Sequential compression devices were placed for VTE prophylaxis. A 16 Fr foley catheter was placed using sterile technique. The patient was then gently repositioned into the prone split leg position, paying careful attention to pad all pressure points. She was affixed to the bed at multiple points of contact. Appropriate peri-procedural antibiotics of Ancef and Gentamicin were administered.  She was then prepped and draped in the usual sterile fashion.   A surgical timeout was then performed confirming the correct patient, surgery, and laterality.   We began the case by performing an antegrade pyelogram per her previously-placed left nephrostomy tube. This demonstrated lower pole access with large UPJ stone and partial lower pole staghorn stone. We decided to use this access for percutaneous nephrolithotomy. We cut the nephrostomy tube to release the distal curl and advanced an angled glidewire through the tube into the left renal pelvis. We then removed the nephrostomy tube leaving glidewire in place. We advanced a kumpe catheter over the glidewire into the renal pelvis. Keeping the kumpe catheter in the renal pelvis, we removed the glidewire. We maneuvered the kumpe catheter so that the distal end was directed toward the UPJ. We then advanced a sensor wire through the kumpe catheter, down the ureter, and into the bladder under fluoroscopic guidance.  We removed the kumpe catheter. Using a 15 blade scalpel, we enlarged the skin incision to ~1 cm. We advanced a 10 Fr duel lumen open-ended catheter over the sensor wire to the level  of the proximal ureter and advanced a super stiff wire into ureter alongside the sensor wire and down into the bladder under fluoroscopic guidance. We then exchanged the sensor wire for a 2nd super still wire. We removed the open-ended catheter leaving both wires in place. We then developed our percutaneous tract with the advancement of a 30 French x 20 cm balloon dilator.  After this, a 30 French sheath was advanced to the edge of the distal calyx on fluoroscopy.     We then performed rigid nephroscopy with the trilogy lithotrite. Immediately upon entrance into the collecting system, we encountered the partial staghorn lower pole stone, and we then proceeded to treat the stone with lithotripsy. Once we had treated a finger of this stone, we then encountered the large UPJ stone, which we proceeded to treat with lithotripsy. Once all visible stone had been cleared, we switched to a flexible nephroscope. We visualized the upper and interpolar calces, in which no stones were present. We then visualized the other lower pole calyx with an emanating residual finger of the staghorn stone. We attempted to maneuver the stone out of the calyx using a zero-tipped basket, but we were unsuccessful. We switched back to the rigid nephroscope with lithotrite and proceeded to treat this remaining stone with lithotripsy. Once the stone was smaller, we were able to encompass it with a zero-tipped basket through our nephroscope and remove the large remaining fragment in entirety. We sent the stone for stone analysis. All blood clots were then removed from the collecting system. We withdrew our sheath over our rigid nephroscope to the edge of renal parenchyma, which did not  reveal any residual stone fragments. We then switched back to our flexible nephroscope and again visualized all calyces with no residual stones present. No residual stones were visible on fluoroscopy, and there were no filling defects on subsequent pyelogram with  widely-patent ureter. We placed a sensor wire through our flexible nephroscope down the ureter and into the bladder under fluoroscopy. We then removed the flexible nephroscope leaving sensor wire in place.   We back-loaded the sensor wire through our rigid nephroscope and proceeded to place a 6 Fr x 24 cm JJ ureteral stent without string over our sensor wire in an antegrade fashion under visual and fluoroscopic guidance with the aid of a stent pusher. Removal of the indwelling sensor wire demonstrated appropriate proximal stent curl in the renal pelvis and distal end in the bladder. We removed our rigid nephroscope. We then advanced a 24 Fr hematuria foley catheter over one of our super stiff wires into the renal pelvis under fluoroscopic guidance. We removed the indwelling super stiff wire and filled the foley balloon with 2cc contrast. We irrigated the foley catheter with pink-tinged output. Given mild degree of hematuria and minimal bleeding from access site, we elected to remove the foley catheter that was acting as a nephrostomy tube. No significant bleeding was noted from the access site after foley removal. We removed the final remaining super stiff wire. Local anesthetic was injected along the access site. The skin was closed with 2 vertical mattress sutures of 2-0 nylon. The site was then dressed in gauze and tegaderm. The patient was carefully returned to supine position.   At this point, the patient was extubated and  taken to the recovery area in stable fashion.  Post-operative plan: - Admit to urology service for routine post-operative care - Anticipate Foley catheter removal on POD1 - CT stone protocol on the morning of POD1 - Continue antibiotics while in the hospital

## 2020-12-24 NOTE — Interval H&P Note (Signed)
History and Physical Interval Note:  12/24/2020 7:07 AM  Ruth Stewart  has presented today for surgery, with the diagnosis of LEFT PARTIAL STAGHORN KIDNEY STONE.  The various methods of treatment have been discussed with the patient and family. After consideration of risks, benefits and other options for treatment, the patient has consented to  Procedure(s) with comments: LEFT NEPHROLITHOTOMY PERCUTANEOUS WIHT SURGEON ACCESS (Left) - REQUESTING 3 HRS as a surgical intervention.  The patient's history has been reviewed, patient examined, no change in status, stable for surgery.  I have reviewed the patient's chart and labs.  Questions were answered to the patient's satisfaction.     Crist Fat

## 2020-12-25 ENCOUNTER — Observation Stay (HOSPITAL_COMMUNITY): Payer: No Typology Code available for payment source

## 2020-12-25 ENCOUNTER — Encounter (HOSPITAL_COMMUNITY): Payer: Self-pay | Admitting: Urology

## 2020-12-25 LAB — CBC
HCT: 33.3 % — ABNORMAL LOW (ref 36.0–46.0)
Hemoglobin: 10.3 g/dL — ABNORMAL LOW (ref 12.0–15.0)
MCH: 27.9 pg (ref 26.0–34.0)
MCHC: 30.9 g/dL (ref 30.0–36.0)
MCV: 90.2 fL (ref 80.0–100.0)
Platelets: 183 10*3/uL (ref 150–400)
RBC: 3.69 MIL/uL — ABNORMAL LOW (ref 3.87–5.11)
RDW: 14.8 % (ref 11.5–15.5)
WBC: 8.9 10*3/uL (ref 4.0–10.5)
nRBC: 0 % (ref 0.0–0.2)

## 2020-12-25 LAB — BASIC METABOLIC PANEL
Anion gap: 9 (ref 5–15)
BUN: 7 mg/dL (ref 6–20)
CO2: 21 mmol/L — ABNORMAL LOW (ref 22–32)
Calcium: 8.6 mg/dL — ABNORMAL LOW (ref 8.9–10.3)
Chloride: 108 mmol/L (ref 98–111)
Creatinine, Ser: 0.75 mg/dL (ref 0.44–1.00)
GFR, Estimated: >60 mL/min (ref >60)
Glucose, Bld: 133 mg/dL — ABNORMAL HIGH (ref 70–99)
Potassium: 3.6 mmol/L (ref 3.5–5.1)
Sodium: 138 mmol/L (ref 135–145)

## 2020-12-25 MED ORDER — TAMSULOSIN HCL 0.4 MG PO CAPS: 0.4000 mg | ORAL_CAPSULE | Freq: Every day | ORAL | 0 refills | Status: AC

## 2020-12-25 MED ORDER — SULFAMETHOXAZOLE-TRIMETHOPRIM 800-160 MG PO TABS: 1.0000 | ORAL_TABLET | Freq: Two times a day (BID) | ORAL | 0 refills | Status: AC

## 2020-12-25 MED ORDER — OXYCODONE HCL 5 MG PO TABS: 5.0000 mg | ORAL_TABLET | ORAL | 0 refills | Status: DC | PRN

## 2020-12-25 MED ORDER — OXYBUTYNIN CHLORIDE 5 MG PO TABS: 5.0000 mg | ORAL_TABLET | Freq: Three times a day (TID) | ORAL | 0 refills | Status: AC | PRN

## 2020-12-25 MED ORDER — DOCUSATE SODIUM 100 MG PO CAPS: 100.0000 mg | ORAL_CAPSULE | Freq: Two times a day (BID) | ORAL | 0 refills | Status: AC

## 2020-12-25 NOTE — Progress Notes (Signed)
1 Day Post-Op Subjective: Foley stopped draining overnight; successfully unclogged with irrigation. Otherwise, patient reports doing well. AFHDS. Tolerating regular diet without nausea/emesis. Pain controlled. Ambulating. Not yet passing flatus.   Good thin cherry-colored UOP via foley. Cr normal. Hgb slightly downtrend: 10.3 today from 11.7 postop from 12.8 preop.   Objective: Vital signs in last 24 hours: Temp:  [97.5 F (36.4 C)-98.7 F (37.1 C)] 98.7 F (37.1 C) (03/11 0608) Pulse Rate:  [64-91] 70 (03/11 0608) Resp:  [10-19] 14 (03/11 0608) BP: (100-125)/(60-89) 104/60 (03/11 0608) SpO2:  [98 %-100 %] 98 % (03/11 0608)  Intake/Output from previous day: 03/10 0701 - 03/11 0700 In: 2489.6 [P.O.:480; I.V.:1671.1; IV Piggyback:308.5] Out: 4350 [Urine:4325; Blood:25] Intake/Output this shift: Total I/O In: 461.6 [I.V.:331.6; Other:30; IV Piggyback:100] Out: 3100 [Urine:3100]  Physical Exam:  General: Laying bed in NAD CV: Regular rate Lungs: NWOB on RA Abdomen: Soft, nondistended, nontender GU: Foley in place draining thin cherry-colored urine  Ext: Warm and well-perfused   Lab Results: Recent Labs    12/24/20 1051 12/25/20 0525  HGB 11.7* 10.3*  HCT 37.5 33.3*   BMET Recent Labs    12/24/20 1051 12/25/20 0525  NA 138 138  K 3.5 3.6  CL 107 108  CO2 23 21*  GLUCOSE 127* 133*  BUN 10 7  CREATININE 0.81 0.75  CALCIUM 8.4* 8.6*     Studies/Results: DG C-Arm 1-60 Min-No Report  Result Date: 12/24/2020 Fluoroscopy was utilized by the requesting physician.  No radiographic interpretation.    Assessment/Plan: Ruth Stewart a 40 y.o.female with history of nephrolithiasis s/p right PCNL + 2nd look USE in 03/2019 and recently diagnosed large LEFT stone burden (3 x 1 cm obstructing UPJ component and a 2.6cm LLP branch component) s/p left PCN placement on 11/30/20. She is now POD1 s/p left PCNL and ureteral stent placement on 12/24/20. Overall doing well.    - Advanced to regular diet. ML. Bowel regimen - Remove foley and TOV - CT A/P w/o contrast to determine residual stone burden today [facility] tylenol, PRN oxycodone - PRN zofran - Continue ceftriaxone while in hospital per prior positive urine culture - Encourage ambulation, SCDs, IS - EDD later today. Plan to keep ureteral stent in place x 2 weeks     LOS: 0 days   Ruth Stewart 12/25/2020, 6:41 AM

## 2020-12-25 NOTE — Discharge Instructions (Signed)
Discharge instructions following PCNL  Call your doctor for: Fevers greater than 100.5 Severe nausea or vomiting Increasing pain not controlled by pain medication Increasing redness or drainage from incisions Decreased urine output or a catheter is no longer draining  The number for questions is [phone].  Activity: Gradually increase activity with short frequent walks, 3-4 times a day.  Avoid strenuous activities, like sports, lawn-mowing, or heavy lifting (more than 10-15 pounds).  Wear loose, comfortable clothing that pull or kink the tube or tubes.  Do not drive while taking pain medication, or until your doctor permitts it.  Bathing and dressing changes: You should not shower for 48 hours after surgery.  Do not soak your back in a bathtub.  Diet: It is extremely important to drink plenty of fluids after surgery, especially water.  You may resume your regular diet, unless otherwise instructed.  Medications: May take Tylenol (acetaminophen) or ibuprofen (Advil, Motrin) as directed over-the-counter. Take any prescriptions as directed.  Follow-up appointments: Follow-up appointment will be scheduled with Dr. Zeddie Njie in 10-14 days for hospital check and stent removal.  

## 2020-12-25 NOTE — Discharge Summary (Signed)
Date of admission: 12/24/2020  Date of discharge: 12/25/2020  Admission diagnosis: Nephrolithiasis  Discharge diagnosis: Nephrolithiasis  Secondary diagnoses: None  History and Physical: For full details, please see admission history and physical. Briefly, Ruth Stewart is a 40 y.o. female with history of nephrolithiasis s/p right PCNL + 2nd look USE in 03/2019 and recently diagnosed large LEFT stone burden (3 x 1 cm obstructing UPJ component and a 2.6cm LLP branch component)s/p left PCN placement on 11/30/20. She presented to the hospital on 12/24/20 for scheudled left PCNL.   Hospital Course:  She underwent left percutaneous nephrolithotomy and ureteral stent placement on 12/24/20. She tolerated the procedure well and was transferred to the floor after receiving routine post-operative care. Her diet was gradually advanced to regular, and her pain was controlled with analgesics. Her foley catheter was removed on POD1, and she was able to void spontaneously. CT A/P w/o contrast on POD1 showed no residual left renal stone burden. She was continued on antibiotics during her hospitalization for positive pre-operative urine culture.  By later on POD1, she was tolerating a regular diet, having pain controlled with oral analgesics, voiding spontaneously, and ambulating without difficulty. Thus, she was deemed appropriate for discharge home.   We will schedule her for follow up in 2 week for local cystourethroscopy + left ureteral stent removal and removal of 2 left flank sutures. We will continue her on Bactrim x 3 days on discharge for positive pre-op Ucx + 3 days of peri-stent-pull prophylaxis.   Laboratory values:  Recent Labs    12/24/20 1051 12/25/20 0525  HGB 11.7* 10.3*  HCT 37.5 33.3*   Recent Labs    12/24/20 1051 12/25/20 0525  CREATININE 0.81 0.75    Disposition: Home  Discharge instruction: The patient was instructed to be ambulatory but told to refrain from heavy lifting,  strenuous activity, or driving while taking narcotics .   Discharge medications:  Allergies as of 12/25/2020   No Known Allergies     Medication List    TAKE these medications   acetaminophen 500 MG tablet Commonly known as: TYLENOL Take 2 tablets (1,000 mg total) by mouth every 6 (six) hours as needed for moderate pain. Takes 2 tablets every 6 hours as needed   docusate sodium 100 MG capsule Commonly known as: Colace Take 1 capsule (100 mg total) by mouth 2 (two) times daily for 14 days. Take while taking narcotics   ibuprofen 600 MG tablet Commonly known as: ADVIL Take 1 tablet (600 mg total) by mouth every 6 (six) hours.   ondansetron 4 MG tablet Commonly known as: Zofran Take 1 tablet (4 mg total) by mouth every 8 (eight) hours as needed for nausea or vomiting.   oxybutynin 5 MG tablet Commonly known as: DITROPAN Take 1 tablet (5 mg total) by mouth 3 (three) times daily as needed for up to 14 days for bladder spasms.   sulfamethoxazole-trimethoprim 800-160 MG tablet Commonly known as: BACTRIM DS Take 1 tablet by mouth 2 (two) times daily for 3 days. Start the day before your next appointment What changed: You were already taking a medication with the same name, and this prescription was added. Make sure you understand how and when to take each.   sulfamethoxazole-trimethoprim 800-160 MG tablet Commonly known as: BACTRIM DS Take 1 tablet by mouth 2 (two) times daily for 3 days. Take for 3 days following your hospital discharge, starting on 12/26/20 Start taking on: December 26, 2020 What changed: additional instructions  tamsulosin 0.4 MG Caps capsule Commonly known as: Flomax Take 1 capsule (0.4 mg total) by mouth daily after supper for 14 days.   traMADol 50 MG tablet Commonly known as: Ultram Take 1-2 tablets (50-100 mg total) by mouth every 6 (six) hours as needed for moderate pain.       Follow up: 2 weeks for cysto + stent removal and suture removal

## 2020-12-25 NOTE — Progress Notes (Signed)
Pt d/c to home. AVS reviewed with pt. Pt has copy of AVS in spanish and english. Pt confirmed understanding of information using teachback method. PIV removed by PCT without complication. Pt escorted off of unit via wheelchair to car and care of friend.

## 2020-12-28 LAB — BPAM RBC
Blood Product Expiration Date: 202204052359
Blood Product Expiration Date: 202204052359
Unit Type and Rh: 6200
Unit Type and Rh: 6200

## 2020-12-28 LAB — TYPE AND SCREEN
ABO/RH(D): A POS
Antibody Screen: POSITIVE
Donor AG Type: NEGATIVE
Donor AG Type: NEGATIVE
Unit division: 0
Unit division: 0

## 2020-12-29 LAB — CALCULI, WITH PHOTOGRAPH (CLINICAL LAB)
Carbonate Apatite: 10 %
Mg NH4 PO4 (Struvite): 90 %
Weight Calculi: 322 mg

## 2021-01-18 ENCOUNTER — Ambulatory Visit: Payer: No Typology Code available for payment source

## 2021-02-26 ENCOUNTER — Other Ambulatory Visit: Payer: Self-pay

## 2021-02-26 ENCOUNTER — Encounter: Payer: Self-pay | Admitting: Registered"

## 2021-02-26 ENCOUNTER — Encounter: Payer: No Typology Code available for payment source | Attending: Physician Assistant | Admitting: Registered"

## 2021-02-26 DIAGNOSIS — Z713 Dietary counseling and surveillance: Secondary | ICD-10-CM | POA: Insufficient documentation

## 2021-02-26 NOTE — Progress Notes (Signed)
Medical Nutrition Therapy  Appointment Start time:  [phone]  Appointment End time:  1030 (RD attempted to end appointment at 1 hour but patient continued to ask questions after being notified that we were out of time and we needed to wait until next visit to cover more   Interpreter Estill Bamberg from [facility]  Primary concerns today: diet for weight loss, kidney stones, blood pressure  Referral diagnosis: E66.3 (ICD-10-CM) - Overweight Preferred learning style: no preference indicated Learning readiness: contemplating   NUTRITION ASSESSMENT   Anthropometrics  Wt Readings from Last 3 Encounters:  12/24/20 211 lb 8 oz (95.9 kg)  12/17/20 211 lb 8 oz (95.9 kg)  12/15/20 207 lb 9.6 oz (94.2 kg)     Clinical from [facility]) Medical Hx: kidney stones, (potential) elevated blood pressure, Microcytic anemia, prediabetes Medications: reviewed Labs: A1c for prediabetes diagnosis was not included in referral paperwork; BP 138/94 Notable Signs/Symptoms: joint pain  Lifestyle & Dietary Hx Patient brought in a "keto" supplements for weight loss to ask my opinion.   Pt listed Arti King in medication list, RD could not add to medication list, not in Epic data base. This particular supplement has an FDA warning d/t hidden drug ingredients that can be potentially dangerous.  Patient states in March her blood pressure was fine. Pt reports over the last month her stress has increased due to issues in her relationship.   Diet: Pt states she stopped having green smoothie for breakfast because she was told that the oatmeal she includes will make her gain weight. Pt state she avoid peanut butter for the same reason. Ptates she has sweet cravings.  Estimated daily fluid intake: not assessed - important to assess next visit when discussing kidney stones Supplements: Keto supplement, Faye Ramsay (FDA Warning) Sleep: not assessed Stress / self-care: not assessed Current  average weekly physical activity: not assessed  24-Hr Dietary Recall First Meal: "whatever I have" eggs, beans, 3 tortillas, coffee with 1 T sugar milk, water Snack:  Second Meal: meatball soup, carrot, potatoes,  Snack:  Third Meal: ice cream cone OR chips OR sweet peanuts Snack: cookies, flan, sweet bread,  Beverages: need to assess in more detail next visit  Estimated Energy Needs Calories: 1800   NUTRITION DIAGNOSIS  NB-1.1 Food and nutrition-related knowledge deficit As related to assuming "natural" supplements are safe.  As evidenced by taking a supplment that has an FDA warning and resistance to heeding the warning because it makes her feel better.   NUTRITION INTERVENTION  Nutrition education (E-1) on the following topics:  . Supplement testing lab symbols to look for to avoid products that have ingredients that are not listed on the label in the ingredients . Why her supplement was making her joints feel better because of medications included with the "natural" ingredients . What "keto" means when on labels "referring to keto diet" . Decreasing sugar in diet, changing taste buds . Using Delay and Distract to help reduce eating sweet food when she gets cravings . MyPlate and balanced diet including review basic food groups  Handouts Provided Include   FDA warning for Tomasita Morrow supplement  MyPlate (in [place])  Learning Style & Readiness for Change Teaching method utilized: Visual & Auditory  Demonstrated degree of understanding via: Teach Back  Barriers to learning/adherence to lifestyle change: influenced by marketing of food and supplements to make decisions for health.  Plan . Do not take the Tomasita Morrow supplement due to potentially dangerous  ingredients . Try strategies to not eat sweets as often due to cravings . Aim to eat balanced meals including all three categories of foods and including vegetables daily  MONITORING & EVALUATION Dietary intake, weekly  physical activity, and [place] lab (kidney stones) in 6 weeks.  Next Steps  Patient is to be careful when choosing supplements.  Kidneys stones/ blood pressure

## 2021-02-26 NOTE — Patient Instructions (Addendum)
   Above symbols are indicating the supplement has been tested to make sure it does not have other ingredients not listed on label  Next visit we can focus on foods to help address blood pressure, meat consumption.

## 2021-03-11 ENCOUNTER — Other Ambulatory Visit: Payer: Self-pay | Admitting: Physician Assistant

## 2021-03-11 DIAGNOSIS — N644 Mastodynia: Secondary | ICD-10-CM

## 2021-03-18 ENCOUNTER — Other Ambulatory Visit: Payer: Self-pay

## 2021-03-18 DIAGNOSIS — N644 Mastodynia: Secondary | ICD-10-CM

## 2021-04-02 ENCOUNTER — Ambulatory Visit: Payer: No Typology Code available for payment source | Admitting: Registered"

## 2021-04-08 ENCOUNTER — Ambulatory Visit: Payer: Self-pay | Admitting: *Deleted

## 2021-04-08 ENCOUNTER — Ambulatory Visit
Admission: RE | Admit: 2021-04-08 | Discharge: 2021-04-08 | Disposition: A | Discharge status: Home / Self Care | Payer: No Typology Code available for payment source | Source: Ambulatory Visit | Attending: Obstetrics and Gynecology | Admitting: Obstetrics and Gynecology

## 2021-04-08 ENCOUNTER — Other Ambulatory Visit: Payer: Self-pay | Admitting: Obstetrics and Gynecology

## 2021-04-08 ENCOUNTER — Other Ambulatory Visit: Payer: Self-pay

## 2021-04-08 VITALS — BP 130/88 | Wt 213.5 lb

## 2021-04-08 DIAGNOSIS — N6310 Unspecified lump in the right breast, unspecified quadrant: Secondary | ICD-10-CM

## 2021-04-08 DIAGNOSIS — Z1239 Encounter for other screening for malignant neoplasm of breast: Secondary | ICD-10-CM

## 2021-04-08 NOTE — Patient Instructions (Signed)
Explained breast self awareness with Bud Face. Patient did not need a Pap smear today due to last Pap smear and HPV typing was 12/17/2020. Let patient know that  Referred patient to the [facility] for a diagnostic mammogram. Appointment scheduled Thursday, April 08, 2021 at 1520. Patient aware of appointment and will be there. Tyeshia Pena-Ruiz verbalized understanding.  Dajah Fischman, Kathaleen Maser, RN 2:25 PM

## 2021-04-08 NOTE — Progress Notes (Signed)
[name] is a 40 y.o. female who presents to [facility] clinic today with complaint of right breast pain and lump x 2 months. Patient stated the pain has decreased since one week after. Patient stated the pain started at a 8 out of 10 and is now a 3 out of 10 when touched. Patient stated the lump has been decreasing in size.    Pap Smear: Pap smear not completed today. Last Pap smear was 12/17/2020 at [facility] and was normal with negative HPV. Patient has a history of two abnormal Pap smears 02/18/2011 that was LSIL and 05/24/2011 hat was ASCUS. 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 Per patient all Pap smears have been normal since LEEP has had at least three normal Pap smears.The above Pap smear, colposcopy, and LEEP results are available in Epic.   Physical exam: Breasts Breasts symmetrical. No skin abnormalities bilateral breasts. No nipple retraction bilateral breasts. No nipple discharge bilateral breasts. No lymphadenopathy. No lumps palpated left breast. Palpated a lump within the right breast at 3:30 o'clock next to nipple. Complaints of tenderness when palpated right breast lump.  MS DIGITAL SCREENING TOMO BILATERAL  Result Date: 12/22/2020 CLINICAL DATA:  Screening. EXAM: DIGITAL SCREENING BILATERAL MAMMOGRAM WITH TOMOSYNTHESIS AND CAD TECHNIQUE: Bilateral screening digital craniocaudal and mediolateral oblique mammograms were obtained. Bilateral screening digital breast tomosynthesis was performed. The images were evaluated with computer-aided detection. COMPARISON:  Previous exam(s). ACR Breast Density Category c: The breast tissue is heterogeneously dense, which may obscure small masses. FINDINGS: There are no findings suspicious for malignancy. The images were evaluated with computer-aided detection. IMPRESSION: No mammographic evidence of malignancy. A result letter of this screening mammogram will  be mailed directly to the patient. RECOMMENDATION: Screening mammogram in one year. (Code:SM-B-01Y) BI-RADS CATEGORY  1: Negative. Electronically Signed   By: Norva Pavlov M.D.   On: 12/22/2020 14:44   MS DIGITAL SCREENING TOMO BILATERAL  Result Date: 11/18/2019 CLINICAL DATA:  Screening. EXAM: DIGITAL SCREENING BILATERAL MAMMOGRAM WITH TOMO AND CAD COMPARISON:  None. ACR Breast Density Category c: The breast tissue is heterogeneously dense, which may obscure small masses FINDINGS: There are no findings suspicious for malignancy. Images were processed with CAD. IMPRESSION: No mammographic evidence of malignancy. A result letter of this screening mammogram will be mailed directly to the patient. RECOMMENDATION: Screening mammogram in one year. (Code:SM-B-01Y) BI-RADS CATEGORY  1: Negative. Electronically Signed   By: Sande Brothers M.D.   On: 11/18/2019 10:33        Pelvic/Bimanual Pap is not indicated today per BCCCP guidelines.    Smoking History: Patient has never smoked.   Patient Navigation: Patient education provided. Access to services provided for patient through [facility] [place] program. Spanish interpreter Natale Lay from [facility] provided.   Breast and Cervical Cancer Risk Assessment: Patient does not have family history of breast cancer, known genetic mutations, or radiation treatment to the chest before age 61. Patient has history of cervical dysplasia. Patient has no history of being immunocompromised or DES exposure in-utero.  Risk Assessment     Risk Scores       04/08/2021 12/17/2020   Last edited by: Priscille Heidelberg, RN Meryl Dare, CMA   5-year risk: 0.3 % 0.3 %   Lifetime risk: 5.1 % 5.1 %            A: BCCCP exam without pap smear Complaint of  right breast pain and lump.  P: Referred patient to the [facility] for a diagnostic mammogram. Appointment scheduled Thursday, April 08, 2021 at 1520.  Priscille Heidelberg, RN 04/08/2021 2:25 PM

## 2021-04-15 ENCOUNTER — Other Ambulatory Visit: Payer: Self-pay | Admitting: Obstetrics and Gynecology

## 2021-04-15 ENCOUNTER — Other Ambulatory Visit: Payer: Self-pay

## 2021-04-15 ENCOUNTER — Ambulatory Visit
Admission: RE | Admit: 2021-04-15 | Discharge: 2021-04-15 | Disposition: A | Discharge status: Home / Self Care | Payer: No Typology Code available for payment source | Source: Ambulatory Visit | Attending: Obstetrics and Gynecology | Admitting: Obstetrics and Gynecology

## 2021-04-15 DIAGNOSIS — N6310 Unspecified lump in the right breast, unspecified quadrant: Secondary | ICD-10-CM

## 2021-07-15 ENCOUNTER — Other Ambulatory Visit: Payer: Self-pay | Admitting: Obstetrics and Gynecology

## 2021-07-15 DIAGNOSIS — N644 Mastodynia: Secondary | ICD-10-CM

## 2021-07-20 ENCOUNTER — Other Ambulatory Visit: Payer: Self-pay

## 2021-07-20 ENCOUNTER — Ambulatory Visit
Admission: RE | Admit: 2021-07-20 | Discharge: 2021-07-20 | Disposition: A | Discharge status: Home / Self Care | Payer: No Typology Code available for payment source | Source: Ambulatory Visit | Attending: Physician Assistant | Admitting: Physician Assistant

## 2021-07-20 DIAGNOSIS — N644 Mastodynia: Secondary | ICD-10-CM

## 2021-09-15 ENCOUNTER — Ambulatory Visit (INDEPENDENT_AMBULATORY_CARE_PROVIDER_SITE_OTHER): Payer: Self-pay | Admitting: Neurology

## 2021-09-15 ENCOUNTER — Encounter: Payer: Self-pay | Admitting: Neurology

## 2021-09-15 ENCOUNTER — Other Ambulatory Visit: Payer: Self-pay

## 2021-09-15 DIAGNOSIS — M5412 Radiculopathy, cervical region: Secondary | ICD-10-CM

## 2021-09-15 DIAGNOSIS — M79601 Pain in right arm: Secondary | ICD-10-CM

## 2021-09-15 DIAGNOSIS — R200 Anesthesia of skin: Secondary | ICD-10-CM

## 2021-09-15 MED ORDER — GABAPENTIN 300 MG PO CAPS
300.0000 mg | ORAL_CAPSULE | Freq: Three times a day (TID) | ORAL | 2 refills | Status: DC
Start: 2021-09-15 — End: 2021-12-14

## 2021-09-15 MED ORDER — METHYLPREDNISOLONE 4 MG PO TABS
ORAL_TABLET | ORAL | 0 refills | Status: DC
Start: 2021-09-15 — End: 2021-10-05

## 2021-09-15 NOTE — Progress Notes (Signed)
[facility]  PATIENT: Ruth Stewart DOB: 11-08-1980  REFERRING DOCTOR OR PCP: Barry Brunner, PA-C SOURCE: Patient, notes from primary care  _________________________________   HISTORICAL  CHIEF COMPLAINT:  Chief Complaint  Patient presents with   New Patient (Initial Visit)    Rm 1, w interpreter. Paper referral for paresthesia in upper extremities. Sx started in R hand in 2013. Pt was pregnant at the time and worked in cold environment cutting up fruits. Pt recently started having numbness and swelling in finger and moving up to forearm. Unable to sleep at night from pn. Has electric shocks when lifting objects. Fingers become cold. Has had some tingling in R upper leg, 2x, last month.     HISTORY OF PRESENT ILLNESS:  I had the pleasure of seeing patient, Ruth Stewart, at [facility] for neurologic consultation regarding her right arm pain and numbness.  She is a 40 year ld woman who has right arm numbness and pain.   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   She started having pain in the upper right arm and sometimes shoulder/neck.   She had a baby in 2015 and was off work for a couple years and had less pain.    She then returned to work and she would get occasional similar pain.   Three months ago, she began to experience more pain in the arm .    Current pain is in her forearm and into the first 4 fingers.  She has numbness as well.   The pain has a burning quality most of the time but occasionally has an electric tingling sensation.   She has neck and right shoulder pain as well.   Pain is worse if she lay on her right side with head bent.   She notes mild  weakness with lifting heavy items.     She notes some right thigh tingling.   She denies leg weakness.   She denies bladder difficulty  She also notes milder similar left arm symptoms.    Due to pain, she has difficulty falling  asleep at night and staying asleep.  REVIEW OF SYSTEMS: Constitutional: No fevers, chills, sweats, or change in appetite Eyes: No visual changes, double vision, eye pain Ear, nose and throat: No hearing loss, ear pain, nasal congestion, sore throat Cardiovascular: No chest pain, palpitations Respiratory:  No shortness of breath at rest or with exertion.   No wheezes GastrointestinaI: No nausea, vomiting, diarrhea, abdominal pain, fecal incontinence Genitourinary:  No dysuria, urinary retention or frequency.  No nocturia. Musculoskeletal:  No neck pain, back pain Integumentary: No rash, pruritus, skin lesions Neurological: as above Psychiatric: Notes some depression Endocrine: No palpitations, diaphoresis, change in appetite, change in weigh or increased thirst Hematologic/Lymphatic:  No anemia, purpura, petechiae. Allergic/Immunologic: No itchy/runny eyes, nasal congestion, recent allergic reactions, rashes  ALLERGIES: No Known Allergies  HOME MEDICATIONS:  Current Outpatient Medications:    acetaminophen (TYLENOL) 500 MG tablet, Take 2 tablets (1,000 mg total) by mouth every 6 (six) hours as needed for moderate pain. Takes 2 tablets every 6 hours as needed, Disp: 30 tablet, Rfl: 0   buPROPion (WELLBUTRIN XL) 150 MG 24 hr tablet, Take 2 tablets by mouth in the morning., Disp: , Rfl:    Cholecalciferol (VITAMIN D3) 1.25 MG (50000 UT) CAPS, Take 1 capsule by mouth once a week., Disp: , Rfl:    FEROSUL 325 (  65 Fe) MG tablet, Take 325 mg by mouth daily., Disp: , Rfl:    gabapentin (NEURONTIN) 300 MG capsule, Take 1 capsule (300 mg total) by mouth 3 (three) times daily., Disp: 90 capsule, Rfl: 2   ibuprofen (ADVIL,MOTRIN) 600 MG tablet, Take 1 tablet (600 mg total) by mouth every 6 (six) hours., Disp: 30 tablet, Rfl: 0   lisinopril (ZESTRIL) 5 MG tablet, lisinopril 5 mg tablet  Take 1 tablet every day by oral route., Disp: , Rfl:    methylPREDNISolone (MEDROL) 4 MG tablet, Taper from 6  pills po for one day to 1 pill po the last day over 6 days, Disp: 21 tablet, Rfl: 0  PAST MEDICAL HISTORY: Past Medical History:  Diagnosis Date   Cervical dysplasia    History of kidney stones    Iron deficiency anemia    with pregnancy   Medical history reviewed with no changes 04/09/2019   PONV (postoperative nausea and vomiting)     PAST SURGICAL HISTORY: Past Surgical History:  Procedure Laterality Date   CYSTOSCOPY N/A 03/25/2019   Procedure: CYSTOSCOPY FLEXIBLE;  Surgeon: Crist Fat, MD;  Location: [facility];  Service: Urology;  Laterality: N/A;   CYSTOSCOPY/URETEROSCOPY/HOLMIUM LASER/STENT PLACEMENT Right 04/11/2019   Procedure: CYSTOSCOPY RIGHT URETEROSCOPY WITH HOLMIUM LASER STONE EXTRACTION STENT EXCHANGE;  Surgeon: Crist Fat, MD;  Location: [facility];  Service: Urology;  Laterality: Right;   IR NEPHROSTOMY PLACEMENT LEFT  11/30/2020   LEEP  2011   NEPHROLITHOTOMY Right 03/25/2019   Procedure: NEPHROLITHOTOMY PERCUTANEOUS WITH SURGEON ACCESS;  Surgeon: Crist Fat, MD;  Location: [facility];  Service: Urology;  Laterality: Right;   NEPHROLITHOTOMY Left 12/24/2020   Procedure: LEFT NEPHROLITHOTOMY PERCUTANEOUS WITH LASER LITHOTRIPSY;  Surgeon: Crist Fat, MD;  Location: [facility];  Service: Urology;  Laterality: Left;  REQUESTING 3 HRS    FAMILY HISTORY: Family History  Problem Relation Age of Onset   Hypertension Mother    Asthma Mother     SOCIAL HISTORY:  Social History   Socioeconomic History   Marital status: Significant Other    Spouse name: Not on file   Number of children: 6   Years of education: Not on file   Highest education level: Not on file  Occupational History   Not on file  Tobacco Use   Smoking status: Never   Smokeless tobacco: Never  Vaping Use   Vaping Use: Never used  Substance and Sexual Activity   Alcohol use: Yes    Comment: sometimes   Drug use: No   Sexual activity: Yes    Birth control/protection: Implant     Comment: Nexaplanon  Other Topics Concern   Not on file  Social History Narrative   Lives with 4 kids   Right handed   Caffeine: 10 oz of coffee    Social Determinants of Health   Financial Resource Strain: Not on file  Food Insecurity: No Food Insecurity   Worried About Programme researcher, broadcasting/film/video in the Last Year: Never true   Ran Out of Food in the Last Year: Never true  Transportation Needs: No Transportation Needs   Lack of Transportation (Medical): No   Lack of Transportation (Non-Medical): No  Physical Activity: Not on file  Stress: Not on file  Social Connections: Not on file  Intimate Partner Violence: Not on file     PHYSICAL EXAM  There were no vitals filed for this visit.  There is no height or weight on file  to calculate BMI.   General: The patient is well-developed and well-nourished and in no acute distress  HEENT:  Head is Flushing/AT.  Sclera are anicteric.  Funduscopic exam shows normal optic discs and retinal vessels.  Neck: No carotid bruits are noted.  The neck is mildly tender.  Range of motion was normal..  Cardiovascular: The heart has a regular rate and rhythm with a normal S1 and S2. There were no murmurs, gallops or rubs.    Skin: Extremities are without rash or  edema.  Musculoskeletal:  Back is nontender  Neurologic Exam  Mental status: The patient is alert and oriented x 3 at the time of the examination. The patient has apparent normal recent and remote memory, with an apparently normal attention span and concentration ability.   Speech is normal.  Cranial nerves: Extraocular movements are full. Pupils are equal, round, and reactive to light and accomodation.  Visual fields are full.  Facial symmetry is present. There is good facial sensation to soft touch bilaterally.Facial strength is normal.  Trapezius and sternocleidomastoid strength is normal. No dysarthria is noted.  The tongue is midline, and the patient has symmetric elevation of the soft  palate. No obvious hearing deficits are noted.  Motor:  Muscle bulk is normal.   Tone is normal. Strength is  5 / 5 in all 4 extremities.   Sensory: She reports reduced sensation to pinprick over the thenar eminence and in the thumb, index finger and middle finger.  Sensory testing is intact .  Coordination: Cerebellar testing reveals good finger-nose-finger and heel-to-shin bilaterally.  Gait and station: Station is normal.   Gait is normal. Tandem gait is normal. Romberg is negative.   Reflexes: Deep tendon reflexes are symmetric and normal bilaterally.   Plantar responses are flexor.    DIAGNOSTIC DATA (LABS, IMAGING, TESTING) - I reviewed patient records, labs, notes, testing and imaging myself where available.  Lab Results  Component Value Date   WBC 8.9 12/25/2020   HGB 10.3 (L) 12/25/2020   HCT 33.3 (L) 12/25/2020   MCV 90.2 12/25/2020   PLT 183 12/25/2020      Component Value Date/Time   NA 138 12/25/2020 0525   K 3.6 12/25/2020 0525   CL 108 12/25/2020 0525   CO2 21 (L) 12/25/2020 0525   GLUCOSE 133 (H) 12/25/2020 0525   BUN 7 12/25/2020 0525   CREATININE 0.75 12/25/2020 0525   CALCIUM 8.6 (L) 12/25/2020 0525   PROT 7.8 12/15/2020 1108   ALBUMIN 3.8 12/15/2020 1108   AST 26 12/15/2020 1108   ALT 35 12/15/2020 1108   ALKPHOS 80 12/15/2020 1108   BILITOT 0.3 12/15/2020 1108   GFRNONAA >60 12/25/2020 0525   GFRAA >60 04/11/2019 0720   No results found for: CHOL, HDL, LDLCALC, LDLDIRECT, TRIG, CHOLHDL No results found for: ZOXW9U Lab Results  Component Value Date   VITAMINB12 309 04/23/2018   Lab Results  Component Value Date   TSH 1.880 02/26/2018       ASSESSMENT AND PLAN  Cervical radiculopathy - Plan: MR CERVICAL SPINE WO CONTRAST  Arm numbness - Plan: MR CERVICAL SPINE WO CONTRAST  Pain of right upper extremity - Plan: MR CERVICAL SPINE WO CONTRAST   In summary, Ms. Wyatt Galvan is a 40 year old woman with right arm pain and numbness most  consistent with cervical radiculopathy.  Symptoms have worsened over the past month  MRI of the cervical spine without contrast. Medrol Dosepak over 6 days.  Gabapentin 300 mg  p.o. 3 times daily. If symptoms do not improve and the MRI of the cervical spine does not identify a source of her symptoms, we will check an NCV/EMG study. Return in 3 months or sooner if there are new or worsening neurologic symptoms.   Parminder Cupples A. Epimenio Foot, MD, [facility] 09/15/2021, 8:26 PM Certified in Neurology, Clinical Neurophysiology, Sleep Medicine and Neuroimaging  [facility] [address] [place], [place] [address] [phone]

## 2021-09-30 ENCOUNTER — Institutional Professional Consult (permissible substitution): Payer: No Typology Code available for payment source | Admitting: Internal Medicine

## 2021-10-05 ENCOUNTER — Ambulatory Visit (INDEPENDENT_AMBULATORY_CARE_PROVIDER_SITE_OTHER): Payer: Self-pay | Admitting: Pulmonary Disease

## 2021-10-05 ENCOUNTER — Encounter: Payer: Self-pay | Admitting: Pulmonary Disease

## 2021-10-05 ENCOUNTER — Other Ambulatory Visit: Payer: Self-pay

## 2021-10-05 VITALS — BP 130/78 | HR 92 | Temp 98.0°F | Ht 62.0 in | Wt 240.0 lb

## 2021-10-05 DIAGNOSIS — G4710 Hypersomnia, unspecified: Secondary | ICD-10-CM

## 2021-10-05 NOTE — Patient Instructions (Signed)
Home sleep test 

## 2021-10-05 NOTE — Progress Notes (Signed)
Subjective:    Patient ID: Ruth Stewart, female    DOB: [birth year], 40 y.o.   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  History is obtained using Spanish interpreter. Epworth Sleepiness Scale score is 18 and she reports sleepiness while watching TV, sitting inactive, as a passenger in a car or sitting quietly after lunch.  Loud snoring has been noted by her companion and family.  She feels sleepy and tired, symptoms have been ongoing for 2 years.  She reports occasional gasping episodes when she is about to fall asleep she also reports morning headaches. Bedtime can be anytime between 10 PM or midnight, sleep latency is minimal, she sleeps on her right side with 2 pillows, reports 1-2 nocturnal awakenings including nocturia and is out of bed by 5 AM.  She rates the snooze button at least twice before waking up, denies dryness of mouth but has an occasional headaches.  She will nap for about 1 hour and naps are refreshing. There is no history suggestive of cataplexy, sleep paralysis or parasomnias She denies recent weight fluctuations   PMH -cervical radiculopathy on gabapentin    Past Medical History:  Diagnosis Date   Cervical dysplasia    History of kidney stones    Iron deficiency anemia    with pregnancy   Medical history reviewed with no changes 04/09/2019   PONV (postoperative nausea and vomiting)      Past Surgical History:  Procedure Laterality Date   CYSTOSCOPY N/A 03/25/2019   Procedure: CYSTOSCOPY FLEXIBLE;  Surgeon: Crist Fat, MD;  Location: [facility];  Service: Urology;  Laterality: N/A;   CYSTOSCOPY/URETEROSCOPY/HOLMIUM LASER/STENT PLACEMENT Right 04/11/2019   Procedure: CYSTOSCOPY RIGHT URETEROSCOPY WITH HOLMIUM LASER STONE EXTRACTION STENT EXCHANGE;  Surgeon:  Crist Fat, MD;  Location[facility];  Service: Urology;  Laterality: Right;   IR NEPHROSTOMY PLACEMENT LEFT  11/30/2020   LEEP  2011   NEPHROLITHOTOMY Right 03/25/2019   Procedure: NEPHROLITHOTOMY PERCUTANEOUS WITH SURGEON ACCESS;  Surgeon: Crist Fat, MD;  Location[facility];  Service: Urology;  Laterality: Right;   NEPHROLITHOTOMY Left 12/24/2020   Procedure: LEFT NEPHROLITHOTOMY PERCUTANEOUS WITH LASER LITHOTRIPSY;  Surgeon: Crist Fat, MD;  [facility];  Service: Urology;  Laterality: Left;  REQUESTING 3 HRS     No Known Allergies   Social History   Socioeconomic History   Marital status: Significant Other    Spouse name: Not on file   Number of children: 6   Years of education: Not on file   Highest education level: Not on file  Occupational History   Not on file  Tobacco Use   Smoking status: Never   Smokeless tobacco: Never  Vaping Use   Vaping Use: Never used  Substance and Sexual Activity   Alcohol use: Yes    Comment: sometimes   Drug use: No   Sexual activity: Yes    Birth control/protection: Implant    Comment: Nexaplanon  Other Topics Concern   Not on file  Social History Narrative   Lives with 4 kids   Right handed   Caffeine: 10 oz of coffee    Social Determinants of Health   Financial Resource Strain: Not on file  Food Insecurity: No Food Insecurity   Worried About Running  Out of Food in the Last Year: Never true   Ran Out of Food in the Last Year: Never true  Transportation Needs: No Transportation Needs   Lack of Transportation (Medical): No   Lack of Transportation (Non-Medical): No  Physical Activity: Not on file  Stress: Not on file  Social Connections: Not on file  Intimate Partner Violence: Not on file    Family History  Problem Relation Age of Onset   Hypertension Mother    Asthma Mother      Review of Systems Constitutional: negative for anorexia, fevers and sweats  Eyes: negative for irritation,  redness and visual disturbance  Ears, nose, mouth, throat, and face: negative for earaches, epistaxis, nasal congestion and sore throat  Respiratory: negative for cough, dyspnea on exertion, sputum and wheezing  Cardiovascular: negative for chest pain, dyspnea, lower extremity edema, orthopnea, palpitations and syncope  Gastrointestinal: negative for abdominal pain, constipation, diarrhea, melena, nausea and vomiting  Genitourinary:negative for dysuria, frequency and hematuria  Hematologic/lymphatic: negative for bleeding, easy bruising and lymphadenopathy  Musculoskeletal:negative for arthralgias, muscle weakness and stiff joints  Neurological: negative for coordination problems, gait problems, headaches and weakness  Endocrine: negative for diabetic symptoms including polydipsia, polyuria and weight loss     Objective:   Physical Exam  Gen. Pleasant, obese, in no distress, normal affect ENT - no pallor,icterus, no post nasal drip, class 2-3 airway Neck: No JVD, no thyromegaly, no carotid bruits Lungs: no use of accessory muscles, no dullness to percussion, decreased without rales or rhonchi  Cardiovascular: Rhythm regular, heart sounds  normal, no murmurs or gallops, no peripheral edema Abdomen: soft and non-tender, no hepatosplenomegaly, BS normal. Musculoskeletal: No deformities, no cyanosis or clubbing Neuro:  alert, non focal, no tremors       Assessment & Plan:    OSA -  Given excessive daytime somnolence, narrow pharyngeal exam, witnessed apneas & loud snoring, obstructive sleep apnea is very likely & an overnight polysomnogram will be scheduled as a home study. The pathophysiology of obstructive sleep apnea , it's cardiovascular consequences & modes of treatment including CPAP were discused with the patient in detail & they evidenced understanding.  Pretest probability is high

## 2021-10-08 ENCOUNTER — Other Ambulatory Visit: Payer: No Typology Code available for payment source

## 2021-10-15 ENCOUNTER — Other Ambulatory Visit: Payer: Self-pay

## 2021-10-15 ENCOUNTER — Ambulatory Visit
Admission: RE | Admit: 2021-10-15 | Discharge: 2021-10-15 | Disposition: A | Discharge status: Home / Self Care | Payer: No Typology Code available for payment source | Source: Ambulatory Visit | Attending: Neurology | Admitting: Neurology

## 2021-10-15 DIAGNOSIS — M79601 Pain in right arm: Secondary | ICD-10-CM

## 2021-10-15 DIAGNOSIS — M5412 Radiculopathy, cervical region: Secondary | ICD-10-CM

## 2021-10-15 DIAGNOSIS — R200 Anesthesia of skin: Secondary | ICD-10-CM

## 2021-12-14 ENCOUNTER — Ambulatory Visit (INDEPENDENT_AMBULATORY_CARE_PROVIDER_SITE_OTHER): Payer: Self-pay | Admitting: Adult Health

## 2021-12-14 ENCOUNTER — Encounter: Payer: Self-pay | Admitting: Adult Health

## 2021-12-14 ENCOUNTER — Other Ambulatory Visit: Payer: Self-pay

## 2021-12-14 VITALS — BP 108/78 | HR 91 | Ht 62.0 in | Wt 221.0 lb

## 2021-12-14 DIAGNOSIS — M79602 Pain in left arm: Secondary | ICD-10-CM

## 2021-12-14 DIAGNOSIS — R200 Anesthesia of skin: Secondary | ICD-10-CM

## 2021-12-14 DIAGNOSIS — M79601 Pain in right arm: Secondary | ICD-10-CM

## 2021-12-14 MED ORDER — GABAPENTIN 300 MG PO CAPS: 300.0000 mg | ORAL_CAPSULE | Freq: Three times a day (TID) | ORAL | 5 refills | Status: AC

## 2021-12-14 NOTE — Progress Notes (Addendum)
[facility]  PATIENT: Ruth Stewart DOB: November 04, 1980   PRIMARY NEUROLOGIST: Dr. Felecia Shelling REFERRING DOCTOR OR PCP: Brantley Stage, PA-C SOURCE: Patient, notes from primary care  _________________________________   HISTORICAL  CHIEF COMPLAINT:  Chief Complaint  Patient presents with   Follow-up    RM 3 (has cone interpreter) Pt is well, states medicine has helped with the ain but she still feels numbness/tingling on hand     HISTORY OF PRESENT ILLNESS:   Update 12/14/2021 JM: 41 year old female with history of right arm dysesthesias gradually worsening. Reports improvement of arm pain and use of right hand since starting gabapentin but c/o continued numbness/tingling in hand and at times swelling. Currently taking gabapentin 300mg  after work in evening and at bedtime as difficulty tolerating in the morning due to fatigue.  Symptoms can be more sensitive with touching hot surfaces. Reports in Jan experience pain in her right shoulder which radiated down outer portion of arm - this lasted for approx 1 week and since resolved.  Denies any additional shoulder pain.  Also reports milder symptoms in left arm which has been stable since prior visit.  Does continue to complain of neck pain which is unchanged since prior visit.  MR cervical 09/2021 largely unremarkable except mild disc bulging at C5-C6 and C6-C7 without evidence of spinal stenosis or nerve root compression.      Consult visit 09/15/2021 Dr. Felecia Shelling: I had the pleasure of seeing patient, Ruth Stewart, at [facility] for neurologic consultation regarding her right arm pain and numbness.  She is a 40 year ld woman who has right arm numbness and pain.   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   She started having pain in the upper right arm and sometimes shoulder/neck.   She had a baby in 2015 and was off work for a couple years and had less  pain.    She then returned to work and she would get occasional similar pain.   Three months ago, she began to experience more pain in the arm .    Current pain is in her forearm and into the first 4 fingers.  She has numbness as well.   The pain has a burning quality most of the time but occasionally has an electric tingling sensation.   She has neck and right shoulder pain as well.   Pain is worse if she lay on her right side with head bent.   She notes mild  weakness with lifting heavy items.     She notes some right thigh tingling.   She denies leg weakness.   She denies bladder difficulty  She also notes milder similar left arm symptoms.    Due to pain, she has difficulty falling asleep at night and staying asleep.    REVIEW OF SYSTEMS: Constitutional: No fevers, chills, sweats, or change in appetite Eyes: No visual changes, double vision, eye pain Ear, nose and throat: No hearing loss, ear pain, nasal congestion, sore throat Cardiovascular: No chest pain, palpitations Respiratory:  No shortness of breath at rest or with exertion.   No wheezes GastrointestinaI: No nausea, vomiting, diarrhea, abdominal pain, fecal incontinence Genitourinary:  No dysuria, urinary retention or frequency.  No nocturia. Musculoskeletal:  No neck pain, back pain Integumentary: No rash, pruritus, skin lesions Neurological: as above Psychiatric: Notes some depression Endocrine: No palpitations, diaphoresis, change in appetite, change in weigh or increased thirst  Hematologic/Lymphatic:  No anemia, purpura, petechiae. Allergic/Immunologic: No itchy/runny eyes, nasal congestion, recent allergic reactions, rashes  ALLERGIES: No Known Allergies  HOME MEDICATIONS:  Current Outpatient Medications:    lisinopril (ZESTRIL) 5 MG tablet, lisinopril 5 mg tablet  Take 1 tablet every day by oral route., Disp: , Rfl:    gabapentin (NEURONTIN) 300 MG capsule, Take 1 capsule (300 mg total) by mouth 3 (three) times  daily., Disp: 90 capsule, Rfl: 5  PAST MEDICAL HISTORY: Past Medical History:  Diagnosis Date   Cervical dysplasia    History of kidney stones    Iron deficiency anemia    with pregnancy   Medical history reviewed with no changes 04/09/2019   PONV (postoperative nausea and vomiting)     PAST SURGICAL HISTORY: Past Surgical History:  Procedure Laterality Date   CYSTOSCOPY N/A 03/25/2019   Procedure: CYSTOSCOPY FLEXIBLE;  Surgeon: Ardis Hughs, MD;  [facility];  Service: Urology;  Laterality: N/A;   CYSTOSCOPY/URETEROSCOPY/HOLMIUM LASER/STENT PLACEMENT Right 04/11/2019   Procedure: CYSTOSCOPY RIGHT URETEROSCOPY WITH HOLMIUM LASER STONE EXTRACTION STENT EXCHANGE;  Surgeon: Ardis Hughs, MD;  [facility];  Service: Urology;  Laterality: Right;   IR NEPHROSTOMY PLACEMENT LEFT  11/30/2020   LEEP  2011   NEPHROLITHOTOMY Right 03/25/2019   Procedure: NEPHROLITHOTOMY PERCUTANEOUS WITH SURGEON ACCESS;  Surgeon: Ardis Hughs, MD;  [facility];  Service: Urology;  Laterality: Right;   NEPHROLITHOTOMY Left 12/24/2020   Procedure: LEFT NEPHROLITHOTOMY PERCUTANEOUS WITH LASER LITHOTRIPSY;  Surgeon: Ardis Hughs, MD;  [facility];  Service: Urology;  Laterality: Left;  REQUESTING 3 HRS    FAMILY HISTORY: Family History  Problem Relation Age of Onset   Hypertension Mother    Asthma Mother     SOCIAL HISTORY:  Social History   Socioeconomic History   Marital status: Significant Other    Spouse name: Not on file   Number of children: 6   Years of education: Not on file   Highest education level: Not on file  Occupational History   Not on file  Tobacco Use   Smoking status: Never   Smokeless tobacco: Never  Vaping Use   Vaping Use: Never used  Substance and Sexual Activity   Alcohol use: Yes    Comment: sometimes   Drug use: No   Sexual activity: Yes    Birth control/protection: Implant    Comment: Nexaplanon  Other Topics Concern    Not on file  Social History Narrative   Lives with 4 kids   Right handed   Caffeine: 10 oz of coffee    Social Determinants of Health   Financial Resource Strain: Not on file  Food Insecurity: No Food Insecurity   Worried About Charity fundraiser in the Last Year: Never true   Ran Out of Food in the Last Year: Never true  Transportation Needs: No Transportation Needs   Lack of Transportation (Medical): No   Lack of Transportation (Non-Medical): No  Physical Activity: Not on file  Stress: Not on file  Social Connections: Not on file  Intimate Partner Violence: Not on file     PHYSICAL EXAM  Vitals:   12/14/21 1506  BP: 108/78  Pulse: 91  Weight: 221 lb (100.2 kg)  Height: 5\' 2"  (1.575 m)    Body mass index is 40.42 kg/m.   General: The patient is well-developed and well-nourished and in no acute distress  HEENT:  Head is [place]/AT.  Sclera are anicteric.  Funduscopic exam shows normal optic discs and retinal vessels.  Neck: No carotid bruits are noted.  The neck is mildly tender.  Range of motion was normal..  Cardiovascular: The heart has a regular rate and rhythm with a normal S1 and S2. There were no murmurs, gallops or rubs.    Skin: Extremities are without rash or  edema.  Musculoskeletal:  Back is nontender  Neurologic Exam  Mental status: The patient is alert and oriented x 3 at the time of the examination. The patient has apparent normal recent and remote memory, with an apparently normal attention span and concentration ability.   Speech is normal.  Cranial nerves: Extraocular movements are full. Pupils are equal, round, and reactive to light and accomodation.  Visual fields are full.  Facial symmetry is present. There is good facial sensation to soft touch bilaterally.Facial strength is normal.  Trapezius and sternocleidomastoid strength is normal. No dysarthria is noted.  The tongue is midline, and the patient has symmetric elevation of the soft palate. No  obvious hearing deficits are noted.  Motor:  Muscle bulk is normal.   Tone is normal. Strength is  5 / 5 in all 4 extremities.   Sensory: She reports reduced sensation to pinprick over the thenar eminence and in the thumb, index finger and middle finger.  Sensory testing is intact .  Coordination: Cerebellar testing reveals good finger-nose-finger and heel-to-shin bilaterally.  Gait and station: Station is normal.   Gait is normal. Tandem gait is normal. Romberg is negative.   Reflexes: Deep tendon reflexes are symmetric and normal bilaterally.   Plantar responses are flexor.    DIAGNOSTIC DATA (LABS, IMAGING, TESTING) - I reviewed patient records, labs, notes, testing and imaging myself where available.  Lab Results  Component Value Date   WBC 8.9 12/25/2020   HGB 10.3 (L) 12/25/2020   HCT 33.3 (L) 12/25/2020   MCV 90.2 12/25/2020   PLT 183 12/25/2020      Component Value Date/Time   NA 138 12/25/2020 0525   K 3.6 12/25/2020 0525   CL 108 12/25/2020 0525   CO2 21 (L) 12/25/2020 0525   GLUCOSE 133 (H) 12/25/2020 0525   BUN 7 12/25/2020 0525   CREATININE 0.75 12/25/2020 0525   CALCIUM 8.6 (L) 12/25/2020 0525   PROT 7.8 12/15/2020 1108   ALBUMIN 3.8 12/15/2020 1108   AST 26 12/15/2020 1108   ALT 35 12/15/2020 1108   ALKPHOS 80 12/15/2020 1108   BILITOT 0.3 12/15/2020 1108   GFRNONAA >60 12/25/2020 0525   GFRAA >60 04/11/2019 0720   No results found for: CHOL, HDL, LDLCALC, LDLDIRECT, TRIG, CHOLHDL No results found for: HGBA1C Lab Results  Component Value Date   VITAMINB12 309 04/23/2018   Lab Results  Component Value Date   TSH 1.880 02/26/2018       ASSESSMENT AND PLAN  Pain of right upper extremity - Plan: NCV with EMG(electromyography)  Arm numbness - Plan: NCV with EMG(electromyography)  Left arm pain - Plan: NCV with EMG(electromyography)   In summary, Ms. Jayn Christophe is a 42 year old woman with right arm pain and numbness and mild left arm similar  symptoms.  Chronic symptoms which worsened around 07/2021. MR cervical 09/2021 mild disc bulging at C5-C6 and C6-C7 but no spinal stenosis or nerve root compression   Continue Gabapentin 300 mg - advised can take 600mg  nightly if difficulty tolerating daytime dosage.  Refill provided Has MRI unremarkable and symptoms persist, will proceed with EMG/NCV Follow-up will  be determined after completion of testing      CC:  [facility] provider: Dr. Felecia Shelling [facility], [place]   I spent 24 minutes of face-to-face and non-face-to-face time with patient assisted by interpreter.  This included previsit chart review, lab review, study review, order entry, electronic health record documentation, patient education and discussion regarding above topics and answered all the questions to patient satisfaction   Frann Rider, [facility]  [facility] [address] [place], [place] [address]  Phone [phone] Fax [phone] Note: This document was prepared with digital dictation and possible smart phrase technology. Any transcriptional errors that result from this process are unintentional.   I have read the note, and I agree with the clinical assessment and plan.  Richard A. Felecia Shelling, MD, PhD, [facility] Certified in Neurology, Clinical Neurophysiology, Sleep Medicine, Pain Medicine and Neuroimaging  [facility] [address], [place], [place] [address] [phone]

## 2021-12-14 NOTE — Patient Instructions (Addendum)
Your Plan:  Will plan on pursing EMG/nerve conduction study   Continue gabapentin - can try to take 2 capsules (300mg  capsules x2) at night         Thank you for coming to see at [facility]. I hope we have been able to provide you high quality care today.  You may receive a patient satisfaction survey over the next few weeks. We would appreciate your feedback and comments so that we may continue to improve ourselves and the health of our patients.     Electroneuromiografa Electromyoneurogram La electroneuromiografa es un estudio que se realiza para evaluar el funcionamiento de los msculos y de los nervios. Este procedimiento incluye el uso combinado de una electromiografa (EMG) y un estudio de conduccin nerviosa (ECN). La EMG se utiliza para [facility] y los nervios que controlan esos msculos. El ECN, tambin llamado electroneurografa, determina si los nervios conducen bien la electricidad. Estos procedimientos deben hacerse juntos para determinar si los msculos y los nervios estn sanos. Si los [employer] pruebas son [employer], esto puede indicar una enfermedad o una lesin, como una enfermedad neuromuscular o dao nervioso perifrico. Informe al mdico acerca de lo siguiente: Cualquier alergia que tenga. Todos los Nutritional therapist [employer], incluidos vitaminas, hierbas, gotas oftlmicas, cremas y [place] de [address] Cualquier problema de la sangre que tenga. Cirugas a las que se haya sometido. Cualquier afeccin mdica que tenga. Cules son los riesgos? En general, se trata de un procedimiento seguro. Sin embargo, pueden ocurrir complicaciones, por ejemplo: Sangrado o moretones. Infecciones donde se insertaron los electrodos. Qu ocurre antes de esta prueba? Medicamentos [address] medicamentos que le recetan habitualmente antes de [employer] esta prueba. No deje de tomar anticoagulantes a menos que se lo indique su  mdico. Instrucciones generales El mdico puede pedirle que caliente la extremidad que le revisarn con agua tibia, una compresa caliente o envolvindola con Education officer, environmental. No use lociones ni cremas el mismo da que [place]. Qu ocurre durante la prueba? Para la EMG  El mdico le pedir que permanezca en una posicin para poder acceder al Engineer, agricultural se estudiar. Usted estar sentado o acostado. Tambin pueden usar un medicamento para adormecer la zona (anestesia local) y [employer] piel. Le insertarn una aguja muy delgada que tiene un electrodo en el [place], de a un msculo a la vez. Normalmente, se evalan varios msculos durante un nico estudio. Se colocar otro electrodo sobre la piel cerca del msculo. El mdico le pedir que siga permaneciendo quieto. Los electrodos Brakpan actividad elctrica de los msculos. Puede verla en un monitor o escucharla en la sala. Despus de estudiar los msculos en reposo, el mdico le pedir que los contraiga o los flexione. Los electrodos Engineer, drilling actividad elctrica de los msculos. El mdico retirar los electrodos y la aguja con electrodo una vez finalizado el procedimiento. El procedimiento puede variar segn el mdico y el hospital. Para el estudio de conduccin nerviosa  Se colocar sobre la piel, junto al msculo que se estudia, un electrodo que registra la actividad de los nervios (electrodo de Engineer, drilling). Cerca del electrodo de Engineer, maintenance (IT), se colocar un electrodo que se Engineer, maintenance (IT) como referencia (electrodo de [place]). Se aplicar una pasta o un gel sobre la piel entre el electrodo de Mining engineer y el de Engineer, maintenance (IT). Se estimular el nervio con un choque suave. Los electrodos Mining engineer velocidad de los nervios y la fuerza de la [place]. El mdico  retirar los electrodos y el gel una vez finalizado el procedimiento. El procedimiento puede variar segn el mdico y el hospital. Qu puedo esperar despus de  la prueba? Es su responsabilidad retirar los [employer] de la prueba. Consulte al mdico o pregunte en el departamento donde se realiza la prueba cundo estarn [employer]. El mdico puede: Administrarle analgsicos. Controlar los lugares de insercin para asegurarse de que el sangrado se [place]. Usted debera estar en condiciones de conducir hacia y desde la prueba. Las [employer] pueden persistir durante algunas horas despus de la prueba, pero deberan mejorar al da siguiente. Comunquese con un mdico si: Tiene hinchazn, enrojecimiento o secrecin en cualquiera de los lugares de insercin. Resumen La electroneuromiografa es un estudio que se realiza para evaluar el funcionamiento de los msculos y de los nervios. Si los [employer] pruebas son Nutritional therapist, esto puede indicar una enfermedad o una lesin. Es un procedimiento Librarian, academic. Sin embargo, pueden ocurrir problemas, como sangrado e infeccin. El mdico [employer] pruebas para completar este procedimiento. Una de ellas verifica los msculos (EMG) y la otra verifica los nervios (ECN). Es su responsabilidad retirar los [employer] de la prueba. Consulte al mdico o pregunte en el departamento donde se realiza la prueba cundo estarn [employer]. Esta informacin no tiene Theme park manager el consejo del mdico. Asegrese de hacerle al mdico cualquier pregunta que tenga. Document Revised: 06/16/2021 Document Reviewed: 06/16/2021 Elsevier Patient Education  2022 [employer].

## 2021-12-24 ENCOUNTER — Ambulatory Visit (INDEPENDENT_AMBULATORY_CARE_PROVIDER_SITE_OTHER): Payer: Self-pay

## 2021-12-24 ENCOUNTER — Other Ambulatory Visit: Payer: Self-pay

## 2021-12-24 DIAGNOSIS — G4710 Hypersomnia, unspecified: Secondary | ICD-10-CM

## 2021-12-24 DIAGNOSIS — G4733 Obstructive sleep apnea (adult) (pediatric): Secondary | ICD-10-CM

## 2021-12-28 ENCOUNTER — Telehealth: Payer: Self-pay | Admitting: Pulmonary Disease

## 2021-12-28 DIAGNOSIS — G4733 Obstructive sleep apnea (adult) (pediatric): Secondary | ICD-10-CM

## 2021-12-28 NOTE — Telephone Encounter (Signed)
HST showed mild  OSA with AHI 8/ hr ? ? ?Suggest OV with APP / me to discuss treatment options - needs spanish interpreter ?

## 2021-12-30 NOTE — Telephone Encounter (Signed)
ATC patient with [employer] and LMTCB ?

## 2022-01-04 ENCOUNTER — Telehealth: Payer: Self-pay | Admitting: Neurology

## 2022-01-04 NOTE — Telephone Encounter (Signed)
Referral for NCV/EMG sent to [facility] [phone]. ?

## 2022-01-06 NOTE — Telephone Encounter (Signed)
ATC patient with [employer] X2 and LMTCB. Since this as the second attempt I will close this encounter and mail letter to patient to schedule an appointment. Nothing further needed at this time.  ?

## 2022-01-11 ENCOUNTER — Other Ambulatory Visit: Payer: Self-pay

## 2022-01-11 DIAGNOSIS — Z1231 Encounter for screening mammogram for malignant neoplasm of breast: Secondary | ICD-10-CM

## 2022-01-29 IMAGING — CT CT ABD-PELV W/O CM
2 of 3 series · 16 of 46 positions shown, 18 images · non-contrast
Comparison: 11/30/2020.

CLINICAL DATA: Status post lithotripsy on the right. Evaluate for
residual stones.

EXAM:
CT ABDOMEN AND PELVIS WITHOUT CONTRAST
TECHNIQUE: Multidetector CT imaging of the abdomen and pelvis was performed
following the standard protocol without IV contrast.

[Series 3: lung bases · axial · 0.81mm/px · z∈[-180,-50]mm · 13 of 75 slices shown, 15 images]
[im 5/75  soft-tissue]
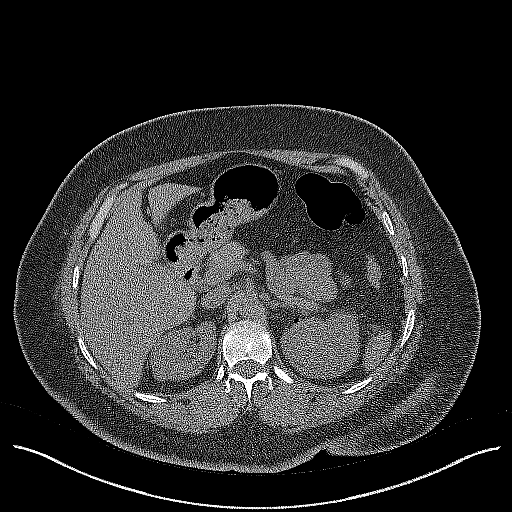
[im 5/75  bone]
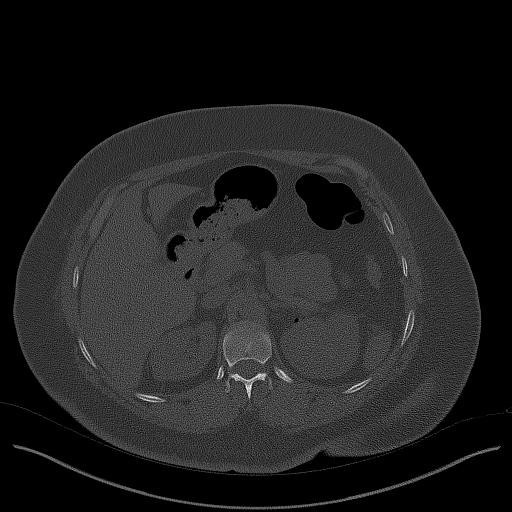
[im 10/75  soft-tissue]
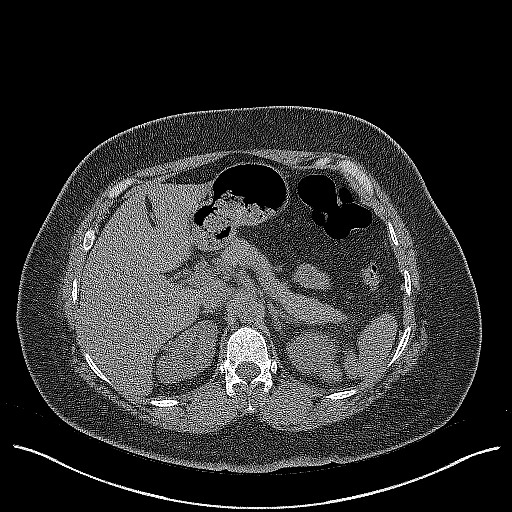
[im 15/75  soft-tissue]
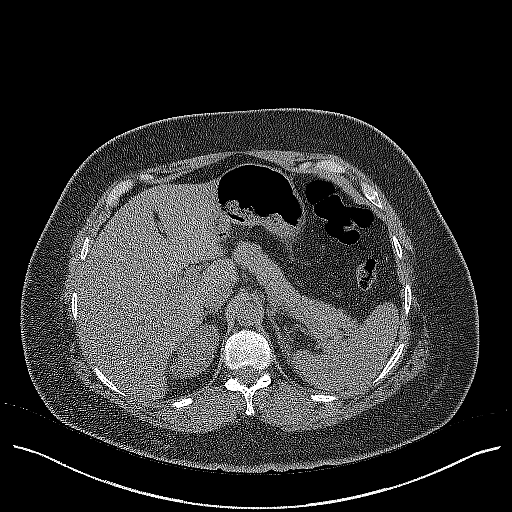
[im 22/75  soft-tissue]
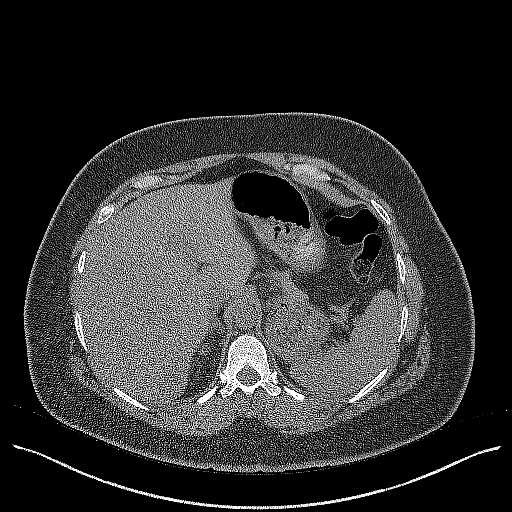
[im 27/75  soft-tissue]
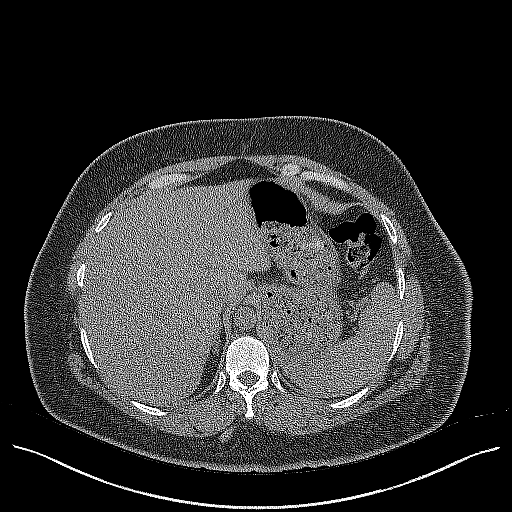
[im 32/75  soft-tissue]
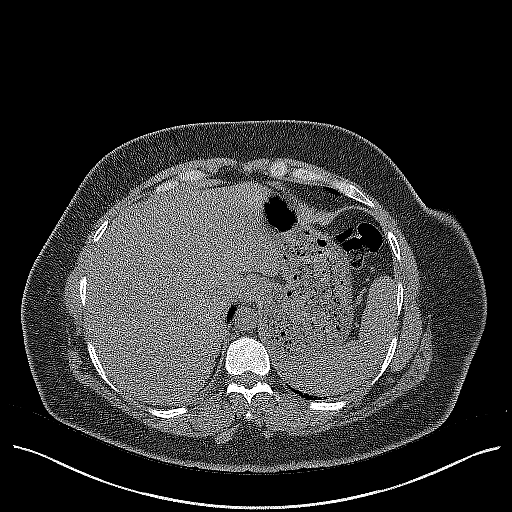
[im 39/75  soft-tissue]
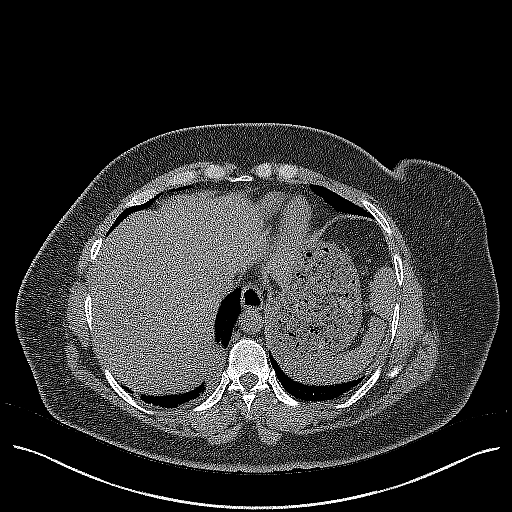
[im 43/75  soft-tissue]
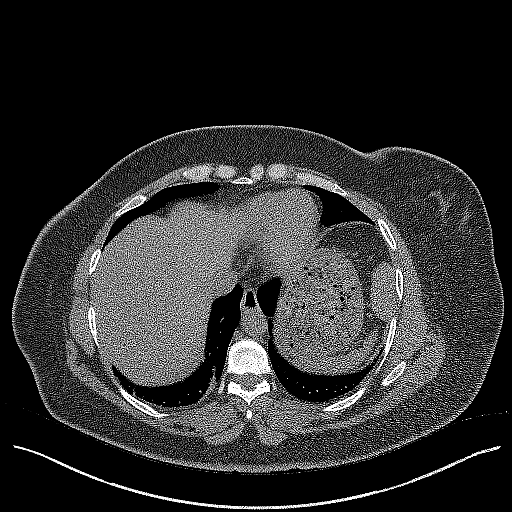
[im 48/75  soft-tissue]
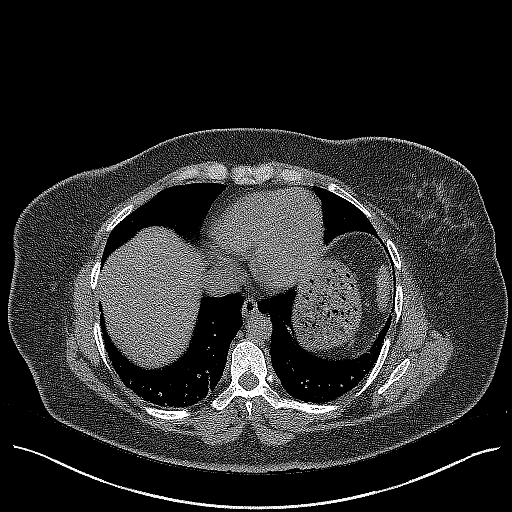
[im 48/75  bone]
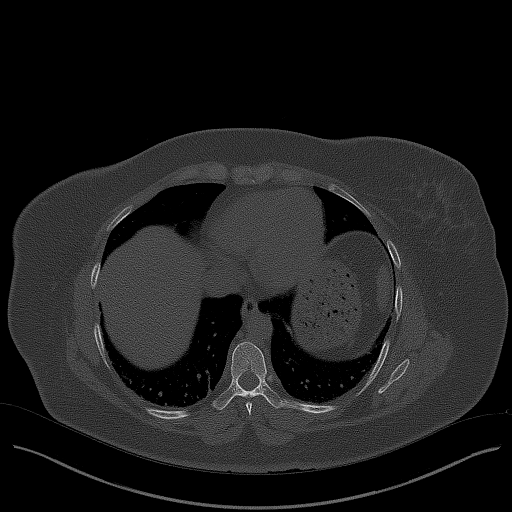
[im 53/75  soft-tissue]
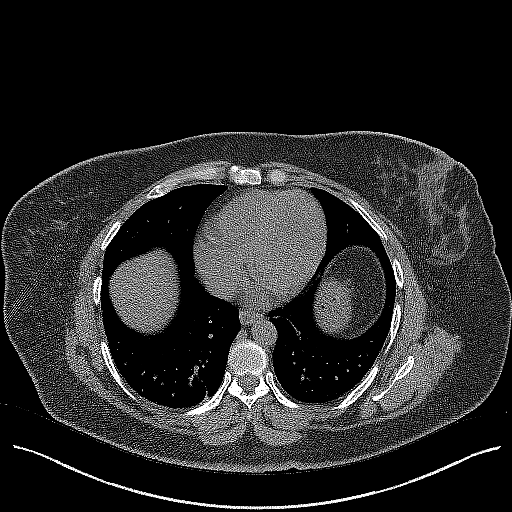
[im 60/75  soft-tissue]
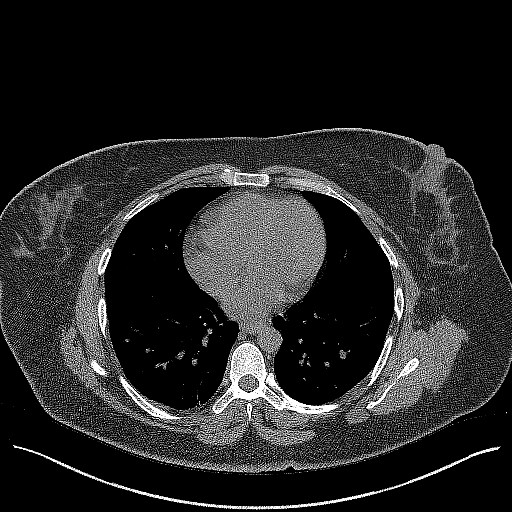
[im 65/75  soft-tissue]
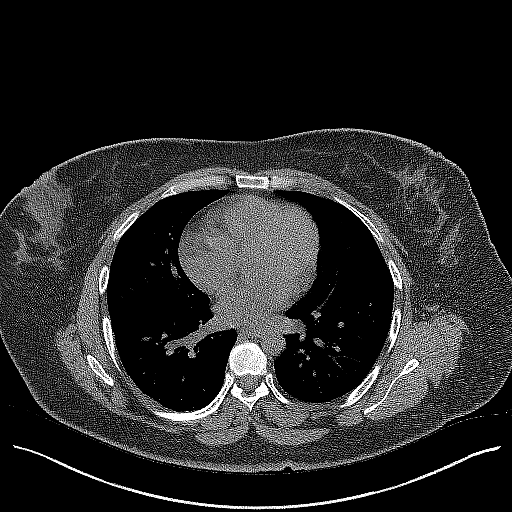
[im 70/75  soft-tissue]
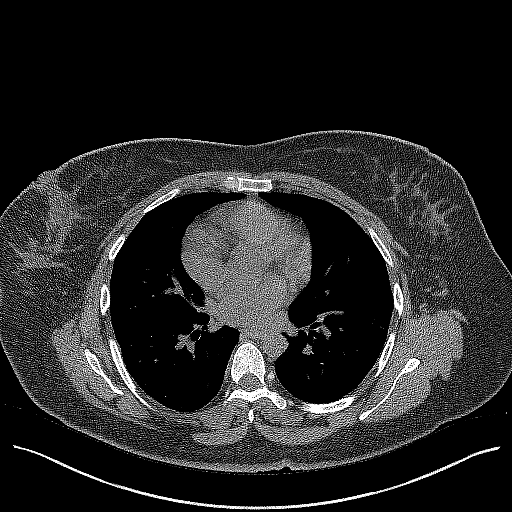

[Series 5: coronal · coronal · 0.82mm/px · 3 of 163 slices shown]
[im 55/163  soft-tissue]
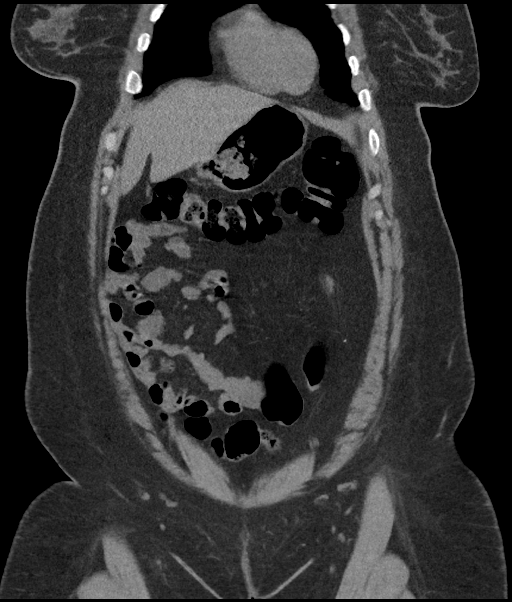
[im 73/163  soft-tissue]
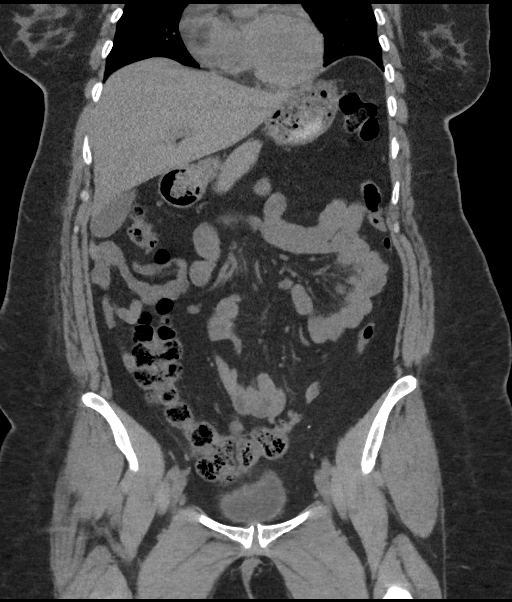
[im 91/163  soft-tissue]
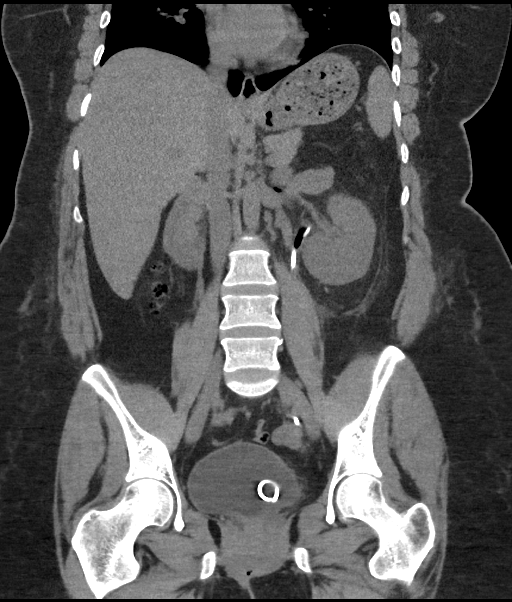

[16 of 46 positions shown; findings below may reference images not displayed]

FINDINGS: Lower chest: Subsegmental volume loss in both lower lobes. Heart
size normal. No pericardial or pleural effusion.

Hepatobiliary: Subcentimeter low-attenuation lesions in the liver
are unchanged and likely cysts. Liver measures 20.6 cm. Liver and
gallbladder are otherwise unremarkable. No biliary ductal
dilatation.

Pancreas: Negative.

Spleen: Negative.

Adrenals/Urinary Tract: Adrenal glands are unremarkable. Punctate
stones in the right kidney. There is air within the left renal
collecting system as well as extraluminal air medial to the left
kidney. Double-J left ureteral stent is seen with the proximal loop
in the left renal pelvis and distal loop formed in the bladder. No
residual stones. Small amount of air in the bladder, presumably
iatrogenic. Minimal left perinephric edema inferiorly.

Stomach/Bowel: Stomach, small bowel and colon are unremarkable.
Appendix is not readily visualized.

Vascular/Lymphatic: Vascular structures are unremarkable. No
pathologically enlarged lymph nodes.

Reproductive: Uterus is visualized.  No adnexal mass.

Other: No free fluid. Mesenteries and peritoneum are unremarkable.
Small umbilical hernia contains fat.

Musculoskeletal: None.
IMPRESSION: 1. Air within the left intrarenal collecting system and bladder,
presumably due to recent instrumentation. Extraluminal air medial to
the left kidney may also be iatrogenic, related to nephrostomy
placement. Please correlate clinically.
2. Double-J left ureteral stent in place without hydronephrosis. No
retained stones.
3. Punctate right renal stones.
4. Hepatomegaly.
5. Umbilical hernia contains fat.

## 2022-02-09 ENCOUNTER — Encounter: Payer: Self-pay | Admitting: Pulmonary Disease

## 2022-02-09 ENCOUNTER — Ambulatory Visit (INDEPENDENT_AMBULATORY_CARE_PROVIDER_SITE_OTHER): Payer: Self-pay | Admitting: Pulmonary Disease

## 2022-02-09 VITALS — BP 102/72 | HR 80 | Temp 98.1°F | Ht 62.0 in | Wt 221.6 lb

## 2022-02-09 DIAGNOSIS — G4733 Obstructive sleep apnea (adult) (pediatric): Secondary | ICD-10-CM

## 2022-02-09 DIAGNOSIS — J849 Interstitial pulmonary disease, unspecified: Secondary | ICD-10-CM

## 2022-02-09 NOTE — Progress Notes (Signed)
? ?  Subjective:  ? ? Patient ID: Ruth Stewart, female    DOB: [birth year], 41 y.o.   MRN: [id number] ? ?HPI ? 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 ?PMH -cervical radiculopathy on gabapentin ? ?She presented with loud snoring, excessive daytime somnolence.  She wakes up every morning at 5 AM.  She is maintained on gabapentin but has self decrease dosage to once daily, takes this at night. ?We reviewed home sleep study results surprisingly this only showed moderate OSA, her symptoms appear to be severe ? ?Significant tests/ events reviewed ? ?HST showed mild  OSA with AHI 8/ hr , total recording time 5 5 8  minutes, lowest desaturation 80%, supine sleep 1 1 4  minutes ? ? ?Review of Systems ?neg for any significant sore throat, dysphagia, itching, sneezing, nasal congestion or excess/ purulent secretions, fever, chills, sweats, unintended wt loss, pleuritic or exertional cp, hempoptysis, orthopnea pnd or change in chronic leg swelling. Also denies presyncope, palpitations, heartburn, abdominal pain, nausea, vomiting, diarrhea or change in bowel or urinary habits, dysuria,hematuria, rash, arthralgias, visual complaints, headache, numbness weakness or ataxia. ? ?   ?Objective:  ? Physical Exam ? ?Gen. Pleasant, obese, in no distress ?ENT - no lesions, no post nasal drip ?Neck: No JVD, no thyromegaly, no carotid bruits ?Lungs: no use of accessory muscles, no dullness to percussion, decreased without rales or rhonchi  ?Cardiovascular: Rhythm regular, heart sounds  normal, no murmurs or gallops, no peripheral edema ?Musculoskeletal: No deformities, no cyanosis or clubbing , no tremors ? ? ? ?   ?Assessment & Plan:  ? ?Obesity -weight loss recommended , target weight less than 220 pounds ?

## 2022-02-09 NOTE — Patient Instructions (Signed)
?  RESMED CPAP 10 cm ( or AutoCPAP ) ? ?Nasal mask  ? ?Weight loss of about 20 lbs - goal 220 lbs ? ?Gabapentin can make you sleepy ? ?You have mild obstructive sleep apnea ? ? ? ?[place] nasal ? ?P?rdida de peso de alrededor de 20 libras - meta 220 libras ? ?La gabapentina puede hacerte sentir somnoliento ? ?Tiene apnea obstructiva del sue?o leve ? ?podemos referirlo a un dentista para un aparato dental ?

## 2022-02-09 NOTE — Assessment & Plan Note (Signed)
Home sleep test only shows mild OSA.  However symptoms appear severe.  This may be due to only 2 hours of supine sleep.  Alternatively, Ruth Stewart may have another cause of her hypersomnolence including gabapentin or less likely narcolepsy.  Ruth Stewart does not have any symptoms of cataplexy. ?I would recommend a treatment trial with either CPAP or oral appliance.  I discussed cost issues with both therapies since Ruth Stewart is self-pay.  I explained to her how Ruth Stewart can obtain a CPAP machine online should Ruth Stewart want to undertake a therapeutic trial.  I would recommend Ruth Stewart start with a nasal mask.  ?

## 2022-03-15 ENCOUNTER — Ambulatory Visit
Admission: RE | Admit: 2022-03-15 | Discharge: 2022-03-15 | Disposition: A | Discharge status: Home / Self Care | Payer: Self-pay | Source: Ambulatory Visit | Attending: Obstetrics and Gynecology | Admitting: Obstetrics and Gynecology

## 2022-03-15 ENCOUNTER — Ambulatory Visit: Payer: Self-pay | Admitting: *Deleted

## 2022-03-15 VITALS — BP 124/72 | Wt 220.1 lb

## 2022-03-15 DIAGNOSIS — N644 Mastodynia: Secondary | ICD-10-CM

## 2022-03-15 DIAGNOSIS — Z1231 Encounter for screening mammogram for malignant neoplasm of breast: Secondary | ICD-10-CM

## 2022-03-15 DIAGNOSIS — Z1239 Encounter for other screening for malignant neoplasm of breast: Secondary | ICD-10-CM

## 2022-03-15 NOTE — Progress Notes (Addendum)
Ms. Jahla Carella is a 41 y.o. female who presents to [facility] clinic today with complaint of right breast nipple area pain since prior to last BCCCP visit 04/08/2021. Patient had a diagnostic mammogram completed 04/15/2021 for follow up that was probably benign with 3-month follow up recommended. Follow up mammogram was completed 07/20/2021 that was negative with a screening mammogram in March 2023 recommended for follow up.   Pap Smear: Pap smear not completed today. Last Pap smear was 12/17/2020 at [facility] clinic and was normal with negative HPV. Patient has a history of two abnormal Pap smears 02/18/2011 that was LSIL and 05/24/2011 hat was ASCUS. 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 Per patient all Pap smears have been normal since LEEP has had at least three normal Pap smears.The above Pap smear, colposcopy, and LEEP results are available in Epic.   Physical exam: Breasts Breasts symmetrical. No skin abnormalities bilateral breasts. No nipple retraction bilateral breasts. No nipple discharge bilateral breasts. No lymphadenopathy. No lumps palpated left breast. Palpated a lump within the right breast at 3:30 o'clock next to nipple. Complaints of tenderness when palpated right breast lump and left inner breast on exam.  MS DIGITAL SCREENING TOMO BILATERAL  Result Date: 12/22/2020 CLINICAL DATA:  Screening. EXAM: DIGITAL SCREENING BILATERAL MAMMOGRAM WITH TOMOSYNTHESIS AND CAD TECHNIQUE: Bilateral screening digital craniocaudal and mediolateral oblique mammograms were obtained. Bilateral screening digital breast tomosynthesis was performed. The images were evaluated with computer-aided detection. COMPARISON:  Previous exam(s). ACR Breast Density Category c: The breast tissue is heterogeneously dense, which may obscure small masses. FINDINGS: There are no findings suspicious for malignancy. The images were evaluated with  computer-aided detection. IMPRESSION: No mammographic evidence of malignancy. A result letter of this screening mammogram will be mailed directly to the patient. RECOMMENDATION: Screening mammogram in one year. (Code:SM-B-01Y) BI-RADS CATEGORY  1: Negative. Electronically Signed   By: Nolon Nations M.D.   On: 12/22/2020 14:44   MS DIGITAL SCREENING TOMO BILATERAL  Result Date: 11/18/2019 CLINICAL DATA:  Screening. EXAM: DIGITAL SCREENING BILATERAL MAMMOGRAM WITH TOMO AND CAD COMPARISON:  None. ACR Breast Density Category c: The breast tissue is heterogeneously dense, which may obscure small masses FINDINGS: There are no findings suspicious for malignancy. Images were processed with CAD. IMPRESSION: No mammographic evidence of malignancy. A result letter of this screening mammogram will be mailed directly to the patient. RECOMMENDATION: Screening mammogram in one year. (Code:SM-B-01Y) BI-RADS CATEGORY  1: Negative. Electronically Signed   By: Kristopher Oppenheim M.D.   On: 11/18/2019 10:33   MS DIGITAL DIAG UNI RIGHT  Result Date: 04/08/2021 CLINICAL DATA:  41 year old female presenting with a lump in the right breast. Patient reports the lump isn't present for approximately two months. She states she had an infection of the right breast and completed a course of antibiotics. The lump has gotten slightly smaller but has not resolved. No current redness or significant pain in the right breast. EXAM: DIGITAL DIAGNOSTIC UNILATERAL RIGHT MAMMOGRAM; ULTRASOUND RIGHT BREAST LIMITED TECHNIQUE: Right digital diagnostic mammography was performed. Mammographic images were processed with CAD.; Targeted ultrasound examination of the right breast was performed COMPARISON:  Previous exam(s). ACR Breast Density Category c: The breast tissue is heterogeneously dense, which may obscure small masses. FINDINGS: Mammogram: Right breast: A skin BB marks the site of concern reported by the patient in the retroareolar slightly outer  right breast. A spot tangential view  of this area is performed in addition to standard views demonstrating an elongated mass, possibly a dilated duct. On physical exam at the site of concern reported by the patient in the periareolar right breast I feel a discrete mass. Ultrasound: Targeted ultrasound performed at the site of concern reported by the patient in the right breast at 9 o'clock retroareolar demonstrating an oval circumscribed hypoechoic mass measuring 0.8 x 0.5 x 0.9 cm. Mild associated vascularity. Additionally inferior to this there is a dilated duct with mobile debris. Targeted ultrasound the right axilla demonstrates 1 lymph node with normal morphology and borderline cortical thickness measuring 0.4 cm. IMPRESSION: 1. Indeterminate mass at the palpable site of concern in the right breast at 9 o'clock retroareolar measuring 0.9 cm. Possibly residual/chronic infection. 2. Benign dilated duct with mobile debris in the right breast at 9 o'clock retroareolar. 3. Borderline abnormal right axillary lymph node with cortical thickness measuring 0.4 cm. RECOMMENDATION: Ultrasound-guided core needle biopsy of the indeterminate mass in the right breast at 9 o'clock retroareolar. If the biopsy returns as atypia or malignancy recommend ultrasound-guided core needle biopsy of the borderline right axillary lymph node. I have discussed the findings and recommendations with the patient. The patient will be scheduled for the biopsy appointment prior to leaving the office today. BI-RADS CATEGORY  4: Suspicious. Electronically Signed   By: Audie Pinto M.D.   On: 04/08/2021 16:37   Pelvic/Bimanual Pap is not indicated today per BCCCP guidelines.   Smoking History: Patient has never smoked.  Patient Navigation: Patient education provided. Access to services provided for patient through [place] program. Spanish interpreter Rudene Anda from [facility] provided.   Breast and Cervical Cancer Risk  Assessment: Patient does not have family history of breast cancer, known genetic mutations, or radiation treatment to the chest before age 35. Patient has history of cervical dysplasia. Patient has no history of being immunocompromised or DES exposure in-utero.  Risk Assessment     Risk Scores       03/15/2022 04/08/2021   Last edited by: Demetrius Revel, LPN Maleya Leever, Heath Gold, RN   5-year risk: 0.4 % 0.3 %   Lifetime risk: 5 % 5.1 %            A: BCCCP exam without pap smear Complaint of right breast pain.  P: Referred patient to the [facility] for a screening mammogram on mobile unit. Appointment scheduled Thursday, March 17, 2022 at 0850.  Loletta Parish, RN 03/15/2022 1:53 PM

## 2022-03-15 NOTE — Patient Instructions (Addendum)
Explained breast self awareness with Bud Face. Patient did not need a Pap smear today due to last Pap smear and HPV typing was 12/17/2020. Let her know that based on her history of abnormal Pap smears and having at least three normal Pap smears in a row that her next Pap smear is due in three years. Referred patient to the [facility] for a screening mammogram on mobile unit. Appointment scheduled Thursday, March 17, 2022 at 0850. Patient aware of appointment and will be there. Let patient know the Breast Center will follow up with her within the next couple weeks with results of mammogram by letter or phone. Bud Face verbalized understanding.  Kalena Mander, Kathaleen Maser, RN 1:53 PM

## 2022-03-17 ENCOUNTER — Ambulatory Visit
Admission: RE | Admit: 2022-03-17 | Discharge: 2022-03-17 | Disposition: A | Discharge status: Home / Self Care | Payer: No Typology Code available for payment source | Source: Ambulatory Visit | Attending: Obstetrics and Gynecology | Admitting: Obstetrics and Gynecology

## 2023-03-16 ENCOUNTER — Other Ambulatory Visit: Payer: Self-pay | Admitting: Obstetrics and Gynecology

## 2023-03-16 DIAGNOSIS — Z1231 Encounter for screening mammogram for malignant neoplasm of breast: Secondary | ICD-10-CM

## 2023-05-18 ENCOUNTER — Ambulatory Visit: Payer: Self-pay | Admitting: Hematology and Oncology

## 2023-05-18 ENCOUNTER — Ambulatory Visit
Admission: RE | Admit: 2023-05-18 | Discharge: 2023-05-18 | Disposition: A | Discharge status: Home / Self Care | Payer: No Typology Code available for payment source | Source: Ambulatory Visit | Attending: Obstetrics and Gynecology | Admitting: Obstetrics and Gynecology

## 2023-05-18 VITALS — BP 122/84 | Wt 222.0 lb

## 2023-05-18 DIAGNOSIS — Z1231 Encounter for screening mammogram for malignant neoplasm of breast: Secondary | ICD-10-CM

## 2023-05-18 NOTE — Patient Instructions (Signed)
Taught Yamira Vullo about self breast awareness and gave educational materials to take home. Patient did not need a Pap smear today due to last Pap smear was in 12/17/2020 per patient. Let her know BCCCP will cover Pap smears every 5 years unless has a history of abnormal Pap smears. Referred patient to the [facility] for screening mammogram. Appointment scheduled for 05/17/2021. Patient aware of appointment and will be there. Let patient know will follow up with her within the next couple weeks with results. Bud Face verbalized understanding.  Pascal Lux, NP 9:50 AM

## 2023-05-18 NOTE — Progress Notes (Signed)
[name] is a 42 y.o. female who presents to [facility] clinic today with no complaints.    Pap Smear: Pap not smear completed today. Last Pap smear was 12/17/2020 and was normal. Per patient has no history of an abnormal Pap smear. Last Pap smear result is available in Epic.   Physical exam: Breasts Breasts symmetrical. No skin abnormalities bilateral breasts. No nipple retraction bilateral breasts. No nipple discharge bilateral breasts. No lymphadenopathy. No lumps palpated bilateral breasts. MM 3D SCREEN BREAST BILATERAL  Result Date: 03/21/2022 CLINICAL DATA:  Screening. EXAM: DIGITAL SCREENING BILATERAL MAMMOGRAM WITH TOMOSYNTHESIS AND CAD TECHNIQUE: Bilateral screening digital craniocaudal and mediolateral oblique mammograms were obtained. Bilateral screening digital breast tomosynthesis was performed. The images were evaluated with computer-aided detection. COMPARISON:  Previous exam(s). ACR Breast Density Category b: There are scattered areas of fibroglandular density. FINDINGS: There are no findings suspicious for malignancy. IMPRESSION: No mammographic evidence of malignancy. A result letter of this screening mammogram will be mailed directly to the patient. RECOMMENDATION: Screening mammogram in one year. (Code:SM-B-01Y) BI-RADS CATEGORY  1: Negative. Electronically Signed   By: Baird Lyons M.D.   On: 03/21/2022 09:47   MS DIGITAL DIAG UNI RIGHT  Result Date: 04/08/2021 CLINICAL DATA:  42 year old female presenting with a lump in the right breast. Patient reports the lump isn't present for approximately two months. She states she had an infection of the right breast and completed a course of antibiotics. The lump has gotten slightly smaller but has not resolved. No current redness or significant pain in the right breast. EXAM: DIGITAL DIAGNOSTIC UNILATERAL RIGHT MAMMOGRAM; ULTRASOUND RIGHT BREAST LIMITED TECHNIQUE: Right digital diagnostic mammography was performed. Mammographic images  were processed with CAD.; Targeted ultrasound examination of the right breast was performed COMPARISON:  Previous exam(s). ACR Breast Density Category c: The breast tissue is heterogeneously dense, which may obscure small masses. FINDINGS: Mammogram: Right breast: A skin BB marks the site of concern reported by the patient in the retroareolar slightly outer right breast. A spot tangential view of this area is performed in addition to standard views demonstrating an elongated mass, possibly a dilated duct. On physical exam at the site of concern reported by the patient in the periareolar right breast I feel a discrete mass. Ultrasound: Targeted ultrasound performed at the site of concern reported by the patient in the right breast at 9 o'clock retroareolar demonstrating an oval circumscribed hypoechoic mass measuring 0.8 x 0.5 x 0.9 cm. Mild associated vascularity. Additionally inferior to this there is a dilated duct with mobile debris. Targeted ultrasound the right axilla demonstrates 1 lymph node with normal morphology and borderline cortical thickness measuring 0.4 cm. IMPRESSION: 1. Indeterminate mass at the palpable site of concern in the right breast at 9 o'clock retroareolar measuring 0.9 cm. Possibly residual/chronic infection. 2. Benign dilated duct with mobile debris in the right breast at 9 o'clock retroareolar. 3. Borderline abnormal right axillary lymph node with cortical thickness measuring 0.4 cm. RECOMMENDATION: Ultrasound-guided core needle biopsy of the indeterminate mass in the right breast at 9 o'clock retroareolar. If the biopsy returns as atypia or malignancy recommend ultrasound-guided core needle biopsy of the borderline right axillary lymph node. I have discussed the findings and recommendations with the patient. The patient will be scheduled for the biopsy appointment prior to leaving the office today. BI-RADS CATEGORY  4: Suspicious. Electronically Signed   By: Emmaline Kluver M.D.   On:  04/08/2021 16:37  MS DIGITAL SCREENING TOMO BILATERAL  Result  Date: 12/22/2020 CLINICAL DATA:  Screening. EXAM: DIGITAL SCREENING BILATERAL MAMMOGRAM WITH TOMOSYNTHESIS AND CAD TECHNIQUE: Bilateral screening digital craniocaudal and mediolateral oblique mammograms were obtained. Bilateral screening digital breast tomosynthesis was performed. The images were evaluated with computer-aided detection. COMPARISON:  Previous exam(s). ACR Breast Density Category c: The breast tissue is heterogeneously dense, which may obscure small masses. FINDINGS: There are no findings suspicious for malignancy. The images were evaluated with computer-aided detection. IMPRESSION: No mammographic evidence of malignancy. A result letter of this screening mammogram will be mailed directly to the patient. RECOMMENDATION: Screening mammogram in one year. (Code:SM-B-01Y) BI-RADS CATEGORY  1: Negative. Electronically Signed   By: Norva Pavlov M.D.   On: 12/22/2020 14:44   MS DIGITAL SCREENING TOMO BILATERAL  Result Date: 11/18/2019 CLINICAL DATA:  Screening. EXAM: DIGITAL SCREENING BILATERAL MAMMOGRAM WITH TOMO AND CAD COMPARISON:  None. ACR Breast Density Category c: The breast tissue is heterogeneously dense, which may obscure small masses FINDINGS: There are no findings suspicious for malignancy. Images were processed with CAD. IMPRESSION: No mammographic evidence of malignancy. A result letter of this screening mammogram will be mailed directly to the patient. RECOMMENDATION: Screening mammogram in one year. (Code:SM-B-01Y) BI-RADS CATEGORY  1: Negative. Electronically Signed   By: Sande Brothers M.D.   On: 11/18/2019 10:33          Pelvic/Bimanual Pap is not indicated today    Smoking History: Patient has never smoked and was not referred to quit line.    Patient Navigation: Patient education provided. Access to services provided for patient through [facility]CP program. [place] interpreter provided. No transportation  provided   Colorectal Cancer Screening: Per patient has never had colonoscopy completed No complaints today.    Breast and Cervical Cancer Risk Assessment: Patient does not have family history of breast cancer, known genetic mutations, or radiation treatment to the chest before age 55. Patient does not have history of cervical dysplasia, immunocompromised, or DES exposure in-utero.  Risk Assessment   No risk assessment data for the current encounter  Risk Scores       03/15/2022   Last edited by: Narda Rutherford, LPN   5-year risk: 0.4%   Lifetime risk: 5%            A: BCCCP exam without pap smear No complaints with benign exam.   P: Referred patient to the [facility] for a screening mammogram. Appointment scheduled 05/18/2023.  Pascal Lux, NP 05/18/2023 9:47 AM
